# Patient Record
Sex: Female | Born: 1960 | Race: Black or African American | Hispanic: No | Marital: Married | Smoking: Never smoker
Health system: Southern US, Community
[De-identification: ages and names within clinical notes are randomized; demographics above are authoritative.]

## PROBLEM LIST (undated history)

## (undated) DIAGNOSIS — M7500 Adhesive capsulitis of unspecified shoulder: Secondary | ICD-10-CM

## (undated) DIAGNOSIS — I10 Essential (primary) hypertension: Secondary | ICD-10-CM

## (undated) DIAGNOSIS — M7752 Other enthesopathy of left foot: Secondary | ICD-10-CM

## (undated) DIAGNOSIS — M1990 Unspecified osteoarthritis, unspecified site: Secondary | ICD-10-CM

## (undated) DIAGNOSIS — D649 Anemia, unspecified: Secondary | ICD-10-CM

## (undated) DIAGNOSIS — M25569 Pain in unspecified knee: Secondary | ICD-10-CM

## (undated) DIAGNOSIS — D563 Thalassemia minor: Secondary | ICD-10-CM

## (undated) DIAGNOSIS — M5432 Sciatica, left side: Secondary | ICD-10-CM

## (undated) DIAGNOSIS — G5603 Carpal tunnel syndrome, bilateral upper limbs: Secondary | ICD-10-CM

## (undated) DIAGNOSIS — K219 Gastro-esophageal reflux disease without esophagitis: Secondary | ICD-10-CM

## (undated) DIAGNOSIS — E8881 Metabolic syndrome: Secondary | ICD-10-CM

## (undated) DIAGNOSIS — M62830 Muscle spasm of back: Secondary | ICD-10-CM

## (undated) DIAGNOSIS — E88819 Insulin resistance, unspecified: Secondary | ICD-10-CM

## (undated) DIAGNOSIS — M7730 Calcaneal spur, unspecified foot: Secondary | ICD-10-CM

## (undated) HISTORY — DX: Sciatica, left side: M54.32

## (undated) HISTORY — DX: Adhesive capsulitis of unspecified shoulder: M75.00

## (undated) HISTORY — DX: Metabolic syndrome: E88.81

## (undated) HISTORY — DX: Essential (primary) hypertension: I10

## (undated) HISTORY — DX: Other enthesopathy of left foot and ankle: M77.52

## (undated) HISTORY — DX: Insulin resistance, unspecified: E88.819

## (undated) HISTORY — PX: TOTAL ABDOMINAL HYSTERECTOMY: SHX209

## (undated) HISTORY — DX: Pain in unspecified knee: M25.569

## (undated) HISTORY — DX: Anemia, unspecified: D64.9

## (undated) HISTORY — DX: Unspecified osteoarthritis, unspecified site: M19.90

## (undated) HISTORY — DX: Carpal tunnel syndrome, bilateral upper limbs: G56.03

## (undated) HISTORY — DX: Thalassemia minor: D56.3

## (undated) HISTORY — DX: Gastro-esophageal reflux disease without esophagitis: K21.9

## (undated) HISTORY — DX: Muscle spasm of back: M62.830

## (undated) HISTORY — DX: Calcaneal spur, unspecified foot: M77.30

---

## 2004-07-29 ENCOUNTER — Encounter (INDEPENDENT_AMBULATORY_CARE_PROVIDER_SITE_OTHER): Payer: Self-pay | Admitting: *Deleted

## 2004-07-29 ENCOUNTER — Inpatient Hospital Stay (HOSPITAL_COMMUNITY): Admission: RE | Admit: 2004-07-29 | Discharge: 2004-07-31 | Payer: Self-pay | Admitting: Obstetrics and Gynecology

## 2005-01-01 ENCOUNTER — Encounter: Admission: RE | Admit: 2005-01-01 | Discharge: 2005-01-01 | Payer: Self-pay | Admitting: Obstetrics and Gynecology

## 2005-01-02 ENCOUNTER — Other Ambulatory Visit: Admission: RE | Admit: 2005-01-02 | Discharge: 2005-01-02 | Payer: Self-pay | Admitting: Obstetrics and Gynecology

## 2005-12-21 ENCOUNTER — Other Ambulatory Visit: Admission: RE | Admit: 2005-12-21 | Discharge: 2005-12-21 | Payer: Self-pay | Admitting: Family Medicine

## 2006-08-06 ENCOUNTER — Encounter: Admission: RE | Admit: 2006-08-06 | Discharge: 2006-08-06 | Payer: Self-pay | Admitting: Obstetrics and Gynecology

## 2007-08-11 ENCOUNTER — Encounter: Admission: RE | Admit: 2007-08-11 | Discharge: 2007-08-11 | Payer: Self-pay | Admitting: Obstetrics and Gynecology

## 2007-09-22 ENCOUNTER — Ambulatory Visit (HOSPITAL_COMMUNITY): Admission: RE | Admit: 2007-09-22 | Discharge: 2007-09-22 | Payer: Self-pay | Admitting: Family Medicine

## 2008-01-25 ENCOUNTER — Emergency Department (HOSPITAL_COMMUNITY): Admission: EM | Admit: 2008-01-25 | Discharge: 2008-01-25 | Payer: Self-pay | Admitting: Emergency Medicine

## 2008-04-23 ENCOUNTER — Emergency Department (HOSPITAL_COMMUNITY): Admission: EM | Admit: 2008-04-23 | Discharge: 2008-04-23 | Payer: Self-pay | Admitting: Emergency Medicine

## 2008-07-12 ENCOUNTER — Encounter: Admission: RE | Admit: 2008-07-12 | Discharge: 2008-10-10 | Payer: Self-pay | Admitting: Obstetrics and Gynecology

## 2008-08-14 ENCOUNTER — Encounter: Admission: RE | Admit: 2008-08-14 | Discharge: 2008-08-14 | Payer: Self-pay | Admitting: Obstetrics and Gynecology

## 2009-08-27 ENCOUNTER — Encounter: Admission: RE | Admit: 2009-08-27 | Discharge: 2009-08-27 | Payer: Self-pay | Admitting: Obstetrics and Gynecology

## 2010-11-09 ENCOUNTER — Encounter: Payer: Self-pay | Admitting: Obstetrics and Gynecology

## 2011-03-06 NOTE — Op Note (Signed)
NAMESUZEN, HETZER                 ACCOUNT NO.:  [id number]   MEDICAL RECORD NO.:  [id number]          PATIENT TYPE:  INP   LOCATION:  9303                          FACILITY:  [facility]   PHYSICIAN:  Hal Morales, M.D.DATE OF BIRTH:  31-Oct-1960   DATE OF PROCEDURE:  07/29/2004  DATE OF DISCHARGE:                                 OPERATIVE REPORT   PREOPERATIVE DIAGNOSES:  1.  Menorrhagia.  2.  Uterine fibroids.  3.  Pelvic pain.  4.  Anemia.   POSTOPERATIVE DIAGNOSES:  1.  Menorrhagia.  2.  Uterine fibroids.  3.  Pelvic pain.  4.  Anemia.  5.  Endometriosis.   PROCEDURE:  Total abdominal hysterectomy and bilateral salpingo-  oophorectomy.   SURGEON:  Hal Morales, M.D.   FIRST ASSISTANT:  Elmira J. Powell, P.A.-C.   ANESTHESIA:  General orotracheal.   ESTIMATED BLOOD LOSS:  350 mL.   COMPLICATIONS:  None.   FINDINGS:  The uterus was enlarged at approximately 18 week's size and  weighed 980 grams.  The ovaries were normal bilaterally except for miniscule  stigmata of endometriosis.  There was also stigmata of endometriosis on the  posterior uterosacral ligaments, primarily on the right side.  The tubes  appeared within normal limits.  There were dense adhesions between the  anterior peritoneum and the anterior uterus.   DESCRIPTION OF PROCEDURE:  The patient was taken to the operating room after  appropriate identification and placed on the operating table.  After the  attainment of adequate general anesthesia with the patient in the supine  position, the abdomen, perineum and vagina were prepped with multiple layers  of Betadine.  A Foley catheter was inserted into the bladder and connected  to straight drainage.  The abdomen was draped in a sterile field.  A midline  subcutaneous infiltration with 20 mL of 0.25% Marcaine was undertaken.  A  midline incision was made and the abdomen opened in layers.  The peritoneum  was entered and peritoneal washings obtained  which were later discarded.  A  self-retaining Balfour retractor was placed in the incision, and the bowel  packed cephalad.  Kelly clamps were placed at the cornual regions of the  uterus on either side and it was noted that the uterus could not be elevated  into the operative field.  The right round ligament was identified, suture  ligated and incised.  The right utero-ovarian ligament was identified,  clamped, cut, tied with a free tie and suture ligated.  On the left side the  round ligament was likewise identified, clamped, cut and suture ligated and  then incised on the anterior leaf of the broad ligament which was taken over  toward the right side.  The peritoneal reflection was then bluntly and  sharply dissected off the anterior cervix and off a large right paracervical  myoma.  The left utero-ovarian ligament was clamped, cut and suture ligated  in three separate portions until the entire ligament was free from the  uterus.  The uterine artery on the left side was skeletonized, clamped, cut  and suture ligated.  On the right side, a large right paracervical myoma was  denuded in order to allow for dissection of the anterior peritoneum off the  anterior cervix so that the uterine artery could be identified beneath the  large right paracervical myoma.  Once the uterine artery was clamped, cut  and suture ligated, the uterine fundus was excised from the cervix and  removed from the operative field.  The paracervical tissues and uterosacral  ligament and then vaginal angles were successfully clamped, cut and suture  ligated with the sutures on the uterosacral ligaments and the sutures on the  vaginal angles being held for later use.  The cervix was excised from the  upper vagina and the remainder of the vaginal cuff closed with figure-of-  eight sutures of 0 Vicryl.  The sutures holding the vaginal angles and the  sutures holding the uterosacral ligaments were tied together on either  side.  Hemostatic suture was placed on the uterosacral ligament on the right side  which had a point of bleeding.  The fallopian tube on the right side was  then elevated and successfully clamped, cut and suture ligated from the  underlying ovary to remove the right fallopian tube.  A similar procedure  was carried out on the opposite side with hemostasis noting to be adequate.  Sutures used for the salpingectomy were 2-0 Vicryl.  Copious irrigation was  carried out, and hemostasis achieved by placing sutures in the right pelvic  side wall bringing the peritoneal surfaces together.  The right and left  ureters were identified as the procedure was taking place and were  reidentified once the uterine fundus had been removed from the operative  field.  Copious irrigation was carried out and all instruments removed from  the abdominal cavity.  The abdominal incision was closed with a Smead-Jones  closure of 0 PDS from each apex to the midline of the incision and tied in  the midline.  A subcutaneous drain, 7 mm Jackson-Pratt was placed through a  stab incision in the right lower quadrant and the subcutaneous tissue  approximated with mattress sutures of 0 plain.  Staples were applied to the  skin incision for closure and the incision covered with a sterile dressing.  The patient was then awakened from general anesthesia and taken to the  recovery room in satisfactory condition having tolerated the procedure well  with sponge and instrument counts correct.  Specimens to pathology were  uterus, cervix, right and left fallopian tubes.      VPH/MEDQ  D:  07/29/2004  T:  07/30/2004  Job:  [id number]

## 2011-03-06 NOTE — H&P (Signed)
Allison Henderson, Allison Henderson                 ACCOUNT NO.:  [id number]   MEDICAL RECORD NO.:  [id number]          PATIENT TYPE:  INP   LOCATION:  9303                          FACILITY:  [facility]   PHYSICIAN:  Hal Morales, M.D.DATE OF BIRTH:  1961/02/14   DATE OF ADMISSION:  07/29/2004  DATE OF DISCHARGE:                                HISTORY & PHYSICAL   HISTORY OF PRESENT ILLNESS:  Allison Henderson is a 50 year old married African-  American female, gravida 0, with a long-standing history of symptomatic  uterine fibroids, who presents for hysterectomy because of menorrhagia,  anemia, pelvic pain, and dysmenorrhea.  For over 5 years, the patient has  experienced heavy vaginal bleeding, requiring her to change a super  overnight pad every hour, accompanied by severe abdominal cramps, which she  rates as a 10/10 on a 10 point scale.  The patient has found relief from her  cramping with the use of ibuprofen 800 mg, but on occasion will have to add  a narcotic in order to achieve reasonable relief which she rates as a 2/10  on a 10 point scale.  In the past several months, in spite of taking  continuous Ovcon 35, the patient experienced a prolonged breakthrough  bleeding episode lasting for approximately 15 days, in which she soaked a  super overnight pad every 15 minutes, and soiled both clothing and linen.  A  hemoglobin done on July 09, 2004 was reported as 8.0.  In the past  year, the patient has had a normal TSH, and a pelvic ultrasound in February  of 2005 revealed a multiple fibroid uterus measuring 15 cm x 11 cm x 11 cm,  with the largest fibroid being partially calcified and measuring 7 cm.  The  patient denies any fever, urinary tract symptoms, or vaginitis symptoms,  though she does report frequent constipation and positional dyspareunia.  The patient also was recently seen in her local emergency department in  [place] due to urinary retention, which was questionably related to the  position of her fibroid uterus.  A discussion was held with the patient  regarding the medical and surgical options available for management of her  symptomatic uterine fibroids, which included uterine artery embolization,  myomectomy, and hysterectomy, as well as observation.  Given that the  patient has failed hormonal therapy and the significant debilitation of her  symptoms, she has opted to proceed with definitive therapy in the form of  hysterectomy.   PAST MEDICAL HISTORY:   OBSTETRIC HISTORY:  Gravida 0.   GYNECOLOGIC HISTORY:  Menarche at 50 years old.  Last menstrual period was  July 01, 2004.  The patient uses oral contraceptives as her method of  contraception.  She does not have any history of abnormal Pap smears or  sexually transmitted diseases.  Her last Pap smear was March of 2005, which  was within normal limits.  The last mammogram was in February of 2005, which  was within normal limits.   MEDICAL HISTORY:  Positive for endocervical polyps, migraine headaches,  anemia, and knee arthritis.   SURGICAL HISTORY:  Negative.  The patient denies any history of blood  transfusions.   FAMILY HISTORY:  Positive for breast cancer (sister age 58), hypertension,  cardiovascular disease, and noninsulin-dependent diabetes mellitus.   SOCIAL HISTORY:  The patient is married, and she is employed as a Acupuncturist at [employer].   HABITS:  She does not use alcohol or tobacco.   CURRENT MEDICATIONS:  1.  Chromagen one tablet twice daily.  2.  Levlen one tablet daily.   ALLERGIES:  1.  PENICILLIN, which causes severe itching.  2.  BEXTRA, which causes severe itching.   REVIEW OF SYSTEMS:  Negative, except as mentioned in the history of present  illness.   PHYSICAL EXAMINATION:  VITAL SIGNS:  Blood pressure is 148/78, weight 315.5  pounds, height 5 feet, 6 inches tall.  NECK:  Supple without masses.  There is no thyromegaly or adenopathy.  HEART:   Regular rate and rhythm.  There is no murmur.  LUNGS:  Clear to auscultation.  There are no wheezes, rales, or rhonchi.  BACK:  No CVA tenderness.  ABDOMEN:  Bowel sounds are present.  It is soft.  The patient does have a  palpable mass in the suprapubic area with mild right lower quadrant  tenderness; however, there is no rebound or organomegaly.  EXTREMITIES:  Without clubbing, cyanosis, or edema.  PELVIC:  EGBUS is within normal limits.  Vaginal is rugosed.  Cervix is  nontender without lesions.  The uterus appears 16-18 weeks size, and  completely fills the pelvis with decreased mobility.  Adnexa with no  palpable masses or tenderness.  RECTOVAGINAL:  No masses.   IMPRESSION:  1.  Symptomatic uterine fibroids.  2.  Menorrhagia.  3.  Anemia.  4.  Dysmenorrhea (cannot rule out endometriosis).  5.  Pelvic pain.   DISPOSITION:  As previously outlined, a long discussion was held with the  patient regarding the medical and surgical options for management of her  presenting symptoms, along with the risks and benefits of each.  The patient  has decided to proceed with hysterectomy, and understands the indications  for this procedure, along with its risks, which including, but are not  limited to, reaction to anesthesia, damage to adjacent organs, and  infection.  The patient also understands that she will no longer be able to  bear children as a result of the removal of her uterus.  The patient has  consented to undergo a total abdominal hysterectomy at [facility]  [place] on July 29, 2004 at 12:30 p.m.      EJP/MEDQ  D:  07/29/2004  T:  07/29/2004  Job:  [id number]

## 2011-03-06 NOTE — Discharge Summary (Signed)
NAMEPEPPER, GRAMLING                 ACCOUNT NO.:  [id number]   MEDICAL RECORD NO.:  [id number]          PATIENT TYPE:  INP   LOCATION:  9303                          [facility]ACILITY:  [facility]   PHYSICIAN:  Hal Morales, M.D.DATE OF BIRTH:  1961-06-26   DATE OF ADMISSION:  07/29/2004  DATE OF DISCHARGE:  07/31/2004                                 DISCHARGE SUMMARY   DISCHARGE DIAGNOSES:  1.  Symptomatic uterine fibroids.  2.  Menorrhagia.  3.  Dysmenorrhea.  4.  Pelvic pain.  5.  Severe anemia.  6.  Endometriosis.  7.  Pelvic adhesions.   OPERATION:  On the date of admission the patient underwent a total abdominal  hysterectomy with bilateral salpingectomy with lysis of adhesions,  tolerating all procedures well.  The patient was found to have a fibroid  uterus weighing approximately 980.5 g with pelvic adhesions and stigmata  consistent with endometriosis.  The patient had normal-appearing tubes and  ovaries bilaterally.   HISTORY OF PRESENT ILLNESS:  [name] is a 50 year old married African-  American female gravida 0 with a longstanding history of symptomatic uterine  fibroids who presents for hysterectomy because of menorrhagia, anemia,  pelvic pain, and dysmenorrhea.  Please see the patient's dictated History  and Physical Examination for details.   PREOPERATIVE PHYSICAL EXAMINATION:  VITAL SIGNS:  Blood pressure 148/78,  weight 315.5 pounds, height 5 feet 6 inches tall.  GENERAL:  Within normal limits.  However, abdominal exam did reveal a  palpable mass in the suprapubic area with mild right lower quadrant  tenderness; however, there was no rebound or organomegaly.  PELVIC:  EG/BUS was within normal limits.  The vagina was rugous.  The  cervix was nontender, without lesions.  The uterus appeared 16- to 18-week  size and completely filled the pelvis, with decreased mobility.  Adnexa  without any palpable masses or tenderness.  Rectovaginal exam was without  masses.   HOSPITAL COURSE:  On the date of admission the patient underwent  aforementioned procedures, tolerating them all well.  Postoperative course  was unremarkable, with the patient tolerating a postoperative hemoglobin of  8.0 (preoperative hemoglobin was 7.9).  By postoperative day #2, the patient  had resumed bowel and bladder function and was therefore deemed ready for  discharge home.   DISCHARGE MEDICATIONS:  1.  Tylox one to two tablets q.4-6h. as needed for pain.  2.  Ibuprofen 600 mg one tablet with food q.6h. for 5 days, then as needed      for pain.  3.  Ferrous sulfate 325 mg one tablet twice daily for 6 weeks.  4.  Phenergan 25 mg one tablet q.6h. as needed for nausea.  5.  Colace 100 mg one tablet twice daily until bowel movements are regular.   FOLLOW-UP:  The patient is scheduled for a staple removal at [facility] on August 06, 2004.  She has a 6 weeks postoperative visit  scheduled with Dr. Dierdre Forth on September 03, 2004 at 2:30 p.m.   DISCHARGE INSTRUCTIONS:  The patient was given a copy of  [facility] postoperative instruction sheet.  She was further advised to avoid  driving for 2 weeks, heavy lifting for 4 weeks, and intercourse for 6 weeks.   FINAL PATHOLOGY:  Uterus and cervix, right and left fallopian tubes:  1.  Uterus - Cervix:  Chronic cervicitis with squamous metaplasia, no      intraepithelial lesion identified.  Endometrium:  Inactive endometrial      glands and decidualized stroma suggestive of exogenous progestin      therapy; benign endometrial polyp 2.8 cm.  Myometrium:  Multiple      leiomyomas including a submucosal leiomyoma.  Serosa:  Endometriosis and      adhesions.  2.  Right and left fallopian tubes:  Benign fallopian tubes with paratubal      cyst.     Elmi   EJP/MEDQ  D:  08/15/2004  T:  08/15/2004  Job:  [id number]

## 2012-02-15 ENCOUNTER — Other Ambulatory Visit: Payer: Self-pay | Admitting: Obstetrics and Gynecology

## 2012-02-16 NOTE — Telephone Encounter (Signed)
This is ep pt

## 2012-02-29 NOTE — Telephone Encounter (Deleted)
Call in wrong rx

## 2012-02-29 NOTE — Telephone Encounter (Deleted)
done

## 2012-03-01 NOTE — Telephone Encounter (Signed)
Per patient, had called in Rx # to pharmacy for refill on another medication with similar number to the Z-pak. Therefore order was cancelled and not filled.

## 2012-03-24 ENCOUNTER — Other Ambulatory Visit (HOSPITAL_COMMUNITY): Payer: Self-pay | Admitting: Obstetrics and Gynecology

## 2012-03-24 DIAGNOSIS — D649 Anemia, unspecified: Secondary | ICD-10-CM

## 2012-03-24 DIAGNOSIS — R7303 Prediabetes: Secondary | ICD-10-CM

## 2012-03-24 DIAGNOSIS — E559 Vitamin D deficiency, unspecified: Secondary | ICD-10-CM

## 2012-04-01 ENCOUNTER — Other Ambulatory Visit: Payer: Self-pay

## 2012-04-01 DIAGNOSIS — D509 Iron deficiency anemia, unspecified: Secondary | ICD-10-CM

## 2012-04-01 DIAGNOSIS — R7303 Prediabetes: Secondary | ICD-10-CM

## 2012-04-01 DIAGNOSIS — D649 Anemia, unspecified: Secondary | ICD-10-CM

## 2012-04-01 DIAGNOSIS — R7309 Other abnormal glucose: Secondary | ICD-10-CM

## 2012-04-01 DIAGNOSIS — E559 Vitamin D deficiency, unspecified: Secondary | ICD-10-CM

## 2012-04-01 LAB — COMPREHENSIVE METABOLIC PANEL
AST: 14 U/L (ref 0–37)
Albumin: 3.8 g/dL (ref 3.5–5.2)
BUN: 13 mg/dL (ref 6–23)
CO2: 27 mEq/L (ref 19–32)
Calcium: 8.9 mg/dL (ref 8.4–10.5)
Chloride: 103 mEq/L (ref 96–112)
Creat: 0.72 mg/dL (ref 0.50–1.10)
Glucose, Bld: 93 mg/dL (ref 70–99)
Potassium: 4.5 mEq/L (ref 3.5–5.3)

## 2012-04-01 LAB — LIPID PANEL
Cholesterol: 201 mg/dL — ABNORMAL HIGH (ref 0–200)
HDL: 61 mg/dL (ref >39)
Total CHOL/HDL Ratio: 3.3 Ratio
Triglycerides: 86 mg/dL (ref <150)

## 2012-04-02 LAB — CBC WITH DIFFERENTIAL/PLATELET
Basophils Absolute: 0 10*3/uL (ref 0.0–0.1)
Basophils Relative: 0 % (ref 0–1)
HCT: 34.5 % — ABNORMAL LOW (ref 36.0–46.0)
Hemoglobin: 10.6 g/dL — ABNORMAL LOW (ref 12.0–15.0)
Lymphocytes Relative: 30 % (ref 12–46)
MCHC: 30.7 g/dL (ref 30.0–36.0)
Monocytes Absolute: 0.7 10*3/uL (ref 0.1–1.0)
Monocytes Relative: 7 % (ref 3–12)
Neutro Abs: 5.4 10*3/uL (ref 1.7–7.7)
Neutrophils Relative %: 62 % (ref 43–77)
RBC: 5.19 MIL/uL — ABNORMAL HIGH (ref 3.87–5.11)
WBC: 8.9 10*3/uL (ref 4.0–10.5)

## 2012-04-02 LAB — HEMOGLOBIN A1C
Hgb A1c MFr Bld: 6.2 % — ABNORMAL HIGH (ref <5.7)
Mean Plasma Glucose: 131 mg/dL — ABNORMAL HIGH (ref <117)

## 2012-04-02 LAB — INSULIN, FASTING: Insulin fasting, serum: 22 u[IU]/mL (ref 3–28)

## 2012-04-24 ENCOUNTER — Other Ambulatory Visit: Payer: Self-pay | Admitting: Obstetrics and Gynecology

## 2012-06-06 ENCOUNTER — Other Ambulatory Visit: Payer: Self-pay | Admitting: Obstetrics and Gynecology

## 2012-06-06 MED ORDER — CYCLOBENZAPRINE HCL 10 MG PO TABS: 10.0000 mg | ORAL_TABLET | Freq: Three times a day (TID) | ORAL | Status: AC | PRN

## 2012-09-30 ENCOUNTER — Encounter: Payer: Self-pay | Admitting: Obstetrics and Gynecology

## 2012-09-30 ENCOUNTER — Ambulatory Visit (INDEPENDENT_AMBULATORY_CARE_PROVIDER_SITE_OTHER): Payer: PRIVATE HEALTH INSURANCE | Admitting: Obstetrics and Gynecology

## 2012-09-30 ENCOUNTER — Telehealth: Payer: Self-pay

## 2012-09-30 VITALS — BP 110/70 | Temp 98.8°F | Ht 64.5 in | Wt 353.0 lb

## 2012-09-30 DIAGNOSIS — Z124 Encounter for screening for malignant neoplasm of cervix: Secondary | ICD-10-CM

## 2012-09-30 DIAGNOSIS — K219 Gastro-esophageal reflux disease without esophagitis: Secondary | ICD-10-CM

## 2012-09-30 DIAGNOSIS — Z01419 Encounter for gynecological examination (general) (routine) without abnormal findings: Secondary | ICD-10-CM

## 2012-09-30 DIAGNOSIS — E8881 Metabolic syndrome: Secondary | ICD-10-CM

## 2012-09-30 DIAGNOSIS — B9789 Other viral agents as the cause of diseases classified elsewhere: Secondary | ICD-10-CM

## 2012-09-30 DIAGNOSIS — R05 Cough: Secondary | ICD-10-CM

## 2012-09-30 DIAGNOSIS — Z20828 Contact with and (suspected) exposure to other viral communicable diseases: Secondary | ICD-10-CM

## 2012-09-30 DIAGNOSIS — N898 Other specified noninflammatory disorders of vagina: Secondary | ICD-10-CM

## 2012-09-30 DIAGNOSIS — B349 Viral infection, unspecified: Secondary | ICD-10-CM

## 2012-09-30 LAB — HEMOGLOBIN A1C: Hgb A1c MFr Bld: 6.2 % — ABNORMAL HIGH (ref <5.7)

## 2012-09-30 LAB — POCT WET PREP (WET MOUNT)
Whiff Test: NEGATIVE
pH: 4.5

## 2012-09-30 LAB — LIPID PANEL
Cholesterol: 212 mg/dL — ABNORMAL HIGH (ref 0–200)
HDL: 59 mg/dL (ref >39)
Triglycerides: 180 mg/dL — ABNORMAL HIGH (ref <150)

## 2012-09-30 LAB — INFLUENZA A AND B

## 2012-09-30 MED ORDER — AZITHROMYCIN 250 MG PO TABS: ORAL_TABLET | ORAL | Status: DC

## 2012-09-30 MED ORDER — GUAIFENESIN-CODEINE 100-10 MG/5ML PO SYRP: 5.0000 mL | ORAL_SOLUTION | Freq: Three times a day (TID) | ORAL | Status: DC | PRN

## 2012-09-30 MED ORDER — NYSTATIN-TRIAMCINOLONE 100000-0.1 UNIT/GM-% EX OINT: TOPICAL_OINTMENT | Freq: Two times a day (BID) | CUTANEOUS | Status: DC

## 2012-09-30 MED ORDER — FLUCONAZOLE 150 MG PO TABS: 150.0000 mg | ORAL_TABLET | Freq: Once | ORAL | Status: DC

## 2012-09-30 MED ORDER — CLINDAMYCIN HCL 300 MG PO CAPS: 300.0000 mg | ORAL_CAPSULE | Freq: Three times a day (TID) | ORAL | Status: DC

## 2012-09-30 NOTE — Telephone Encounter (Signed)
Lm on vm to cb per test results.

## 2012-09-30 NOTE — Telephone Encounter (Signed)
Tc from pt. Informed pt Influenza A and B=both negative. Pt agrees

## 2012-09-30 NOTE — Progress Notes (Signed)
Regular Periods: no Mammogram: yes  Monthly Breast Ex.: yes Exercise: no  Tetanus < 10 years: yes Seatbelts: yes  NI. Bladder Functn.: yes Abuse at home: no  Daily BM's: yes Stressful Work: yes  Healthy Diet: yes Sigmoid-Colonoscopy: 2013  NL  Calcium: yes Medical problems this year: WILL DISCUSS   LAST PAP:2012  NL  Contraception: NONE. PT HAD HYST  Mammogram:  2 YEARS AGO  PCP: DR. Valentina Lucks  PMH: NO CHANGE  FMH: NO CHANGE  Last Bone Scan: NO

## 2012-09-30 NOTE — Progress Notes (Signed)
Subjective:    DETRA BORES is a 51 y.o. female, G0P0, who presents for an annual exam. The patient reports recent intense flare up  of acid reflux that now has developed into a cough with foul smelling sputum.  Denies any fever but this morning began to have sudden and significant myalgias, mild nasal congestion, nausea and pain in chest with cough. Additionally complains of a vaginal bump that was tender for the past week but seemingly is better now,  along with a chronically itchy area just right of her upper vulva.  Menstrual cycle:   LMP: No LMP recorded. Patient has had a hysterectomy.             Review of Systems Pertinent items are noted in HPI. Denies pelvic pain, urinary tract symptoms, vaginitis symptoms, irregular bleeding, menopausal symptoms, change in bowel habits or rectal bleeding   Objective:    BP 110/70  Temp 98.8 F (37.1 C) (Oral)  Ht 5' 4.5" (1.638 m)  Wt 353 lb (160.12 kg)  BMI 59.66 kg/m2   Wt Readings from Last 1 Encounters:  09/30/12 353 lb (160.12 kg)   Body mass index is 59.66 kg/(m^2). General Appearance: Alert, no acute distress HEENT: Grossly normal Neck / Thyroid: Supple, no thyromegaly or cervical adenopathy Lungs: Clear to auscultation bilaterally though breath sounds are distant Back: No CVA tenderness Breast Exam: Pendulous with no palpable masses or nodes.No dimpling, nipple retraction or discharge; tender;  axillary breast tissue bilaterally Cardiovascular: Regular rate and rhythm.  Gastrointestinal: Soft, non-tender, no masses or organomegaly Pelvic Exam: EGBUS-right vulvar apex with dry, pigmented excoriated skin, no visible lesions or inflammation; left mons, approximately 1 cm from vulvar apex with tender firm nodule though not visible nor does it show any pointing;  vagina-normal, cervix/uterus-surgically absent;  adnexae-no masses or tenderness (exam limited by habitus) Rectovaginal: no masses and normal sphincter tone Lymphatic Exam:  Non-palpable nodes in neck, clavicular,  axillary, or inguinal regions  Skin: no rashes or abnormalities Extremities: no clubbing cyanosis or edema  Neurologic: grossly normal Psychiatric: Alert and oriented  Wet Prep: ph-4.5,  whiff-negative, no clue, yeast or trichomoniasis   Assessment:   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 Tinea Mons Follicular Exposure to influenza   Plan:  Reviewed revised PAP smear guidelines  PAP sent-patient request  Warm soaks for mons bump  Tuss-Organidin 5 ml every 6 hours as needed for cough  Clindamycin 300 mg #21 tid x 7 days  Diflucan 150 mg  #1 1 po stat 1 refill  Mycolog II Cream #30 grams apply to affected area bid x 7-14 days 1 refill  Lipid Panel, HgbA1C, nasal swab for flu testing-pending  RTO 1 year or prn  Hamp Moreland,ELMIRAPA-C

## 2012-10-03 DIAGNOSIS — E88819 Insulin resistance, unspecified: Secondary | ICD-10-CM | POA: Insufficient documentation

## 2012-10-03 DIAGNOSIS — M5432 Sciatica, left side: Secondary | ICD-10-CM | POA: Insufficient documentation

## 2012-10-03 DIAGNOSIS — M62830 Muscle spasm of back: Secondary | ICD-10-CM | POA: Insufficient documentation

## 2012-10-03 DIAGNOSIS — G8929 Other chronic pain: Secondary | ICD-10-CM | POA: Insufficient documentation

## 2012-10-03 DIAGNOSIS — K219 Gastro-esophageal reflux disease without esophagitis: Secondary | ICD-10-CM | POA: Insufficient documentation

## 2012-10-03 DIAGNOSIS — E8881 Metabolic syndrome: Secondary | ICD-10-CM | POA: Insufficient documentation

## 2012-10-03 LAB — PAP IG W/ RFLX HPV ASCU

## 2012-10-13 ENCOUNTER — Other Ambulatory Visit: Payer: Self-pay | Admitting: Obstetrics and Gynecology

## 2012-10-13 MED ORDER — ALBUTEROL SULFATE HFA 108 (90 BASE) MCG/ACT IN AERS: 2.0000 | INHALATION_SPRAY | Freq: Four times a day (QID) | RESPIRATORY_TRACT | Status: AC | PRN

## 2012-10-13 MED ORDER — TRIAMCINOLONE ACETONIDE 0.025 % EX OINT: TOPICAL_OINTMENT | Freq: Two times a day (BID) | CUTANEOUS | Status: DC

## 2012-10-13 MED ORDER — NYSTATIN 100000 UNIT/GM EX CREA: TOPICAL_CREAM | Freq: Two times a day (BID) | CUTANEOUS | Status: DC

## 2012-10-14 ENCOUNTER — Other Ambulatory Visit: Payer: Self-pay

## 2012-10-17 ENCOUNTER — Other Ambulatory Visit: Payer: Self-pay

## 2012-10-17 MED ORDER — METFORMIN HCL 500 MG PO TABS: 500.0000 mg | ORAL_TABLET | Freq: Two times a day (BID) | ORAL | Status: DC

## 2012-10-18 ENCOUNTER — Other Ambulatory Visit: Payer: Self-pay

## 2012-10-18 MED ORDER — METFORMIN HCL 500 MG PO TABS: 500.0000 mg | ORAL_TABLET | Freq: Three times a day (TID) | ORAL | Status: DC

## 2012-11-21 ENCOUNTER — Encounter: Payer: Self-pay | Admitting: Obstetrics and Gynecology

## 2012-12-05 ENCOUNTER — Other Ambulatory Visit: Payer: PRIVATE HEALTH INSURANCE

## 2012-12-05 ENCOUNTER — Other Ambulatory Visit: Payer: Self-pay | Admitting: Obstetrics and Gynecology

## 2012-12-05 MED ORDER — KETOROLAC TROMETHAMINE 30 MG/ML IJ SOLN: 30.0000 mg | Freq: Once | INTRAMUSCULAR | Status: DC

## 2012-12-05 MED ORDER — KETOROLAC TROMETHAMINE 30 MG/ML IJ SOLN
30.0000 mg | Freq: Once | INTRAMUSCULAR | Status: AC
  Administered 2012-12-05: 30 mg via INTRAVENOUS

## 2012-12-05 NOTE — Progress Notes (Signed)
51 YO with history of back spasms had an almost fall (slipped) on ice on yesterday and is complaining of back pain today.  Took some anti-inflammatory on yesterday and use topical linament with good results.  Has not taken any pain medication today.  Patient to receive Toradol 30 mg IM stat.  Kasidi Shanker, PA-C

## 2012-12-09 ENCOUNTER — Other Ambulatory Visit: Payer: Self-pay | Admitting: Obstetrics and Gynecology

## 2012-12-09 MED ORDER — FAMOTIDINE 40 MG PO TABS: 20.0000 mg | ORAL_TABLET | Freq: Every day | ORAL | Status: DC | PRN

## 2014-12-27 ENCOUNTER — Encounter (HOSPITAL_COMMUNITY): Payer: Self-pay | Admitting: Emergency Medicine

## 2014-12-27 ENCOUNTER — Emergency Department (HOSPITAL_COMMUNITY)
Admission: EM | Admit: 2014-12-27 | Discharge: 2014-12-27 | Discharge status: Against Medical Advice | Payer: Managed Care, Other (non HMO) | Attending: Emergency Medicine | Admitting: Emergency Medicine

## 2014-12-27 DIAGNOSIS — R42 Dizziness and giddiness: Secondary | ICD-10-CM | POA: Diagnosis not present

## 2014-12-27 NOTE — ED Notes (Signed)
Pt BIB friend.  C/o dizziness and lt sided numbness x 2 wks.

## 2015-04-19 ENCOUNTER — Other Ambulatory Visit: Payer: Self-pay | Admitting: Sports Medicine

## 2015-04-19 DIAGNOSIS — IMO0002 Reserved for concepts with insufficient information to code with codable children: Secondary | ICD-10-CM

## 2015-04-26 ENCOUNTER — Other Ambulatory Visit: Payer: Managed Care, Other (non HMO)

## 2015-04-29 ENCOUNTER — Ambulatory Visit
Admission: RE | Admit: 2015-04-29 | Discharge: 2015-04-29 | Disposition: A | Discharge status: Home / Self Care | Payer: Managed Care, Other (non HMO) | Source: Ambulatory Visit | Attending: Sports Medicine | Admitting: Sports Medicine

## 2015-04-29 ENCOUNTER — Encounter (INDEPENDENT_AMBULATORY_CARE_PROVIDER_SITE_OTHER): Payer: Self-pay

## 2015-04-29 DIAGNOSIS — IMO0002 Reserved for concepts with insufficient information to code with codable children: Secondary | ICD-10-CM

## 2015-10-18 ENCOUNTER — Encounter: Payer: Self-pay | Admitting: Cardiology

## 2015-10-18 ENCOUNTER — Ambulatory Visit (INDEPENDENT_AMBULATORY_CARE_PROVIDER_SITE_OTHER): Payer: Managed Care, Other (non HMO) | Admitting: Cardiology

## 2015-10-18 VITALS — BP 148/100 | HR 85 | Ht 64.0 in | Wt 356.0 lb

## 2015-10-18 DIAGNOSIS — I517 Cardiomegaly: Secondary | ICD-10-CM | POA: Diagnosis not present

## 2015-10-18 DIAGNOSIS — R0602 Shortness of breath: Secondary | ICD-10-CM

## 2015-10-18 DIAGNOSIS — I10 Essential (primary) hypertension: Secondary | ICD-10-CM | POA: Diagnosis not present

## 2015-10-18 DIAGNOSIS — Z136 Encounter for screening for cardiovascular disorders: Secondary | ICD-10-CM

## 2015-10-18 NOTE — Progress Notes (Signed)
Cardiology Office Note  Date: 10/18/2015   ID: Allison Henderson, DOB [birth year], MRN [id number]  PCP: Astrid Divine, MD  Primary Cardiologist: Nona Dell, MD   Chief Complaint  Patient presents with  . Cardiomegaly    History of Present Illness: Allison Henderson is a 54 y.o. female referred for cardiology consultation by [name] at [facility] an [place], [place].  Records indicate recent evaluation at that facility with treatment for pneumonia. Chest x-ray reportedly showed normal mediastinum with no pleural fluid, no CHF, no masses , and a right lower lobe infiltrate. Cardiomegaly was also described.  She comes in today with her sister, very anxious about this referral. She states that she is feeling better in terms of recent cough and diagnosed pneumonia, continues on antibiotics. At baseline she has NYHA class 1-2 dyspnea, no exertional chest pain. She states that her job is stressful, she works at a [employer] in [place]. She does not have any history of cardiomyopathy, arrhythmias, or CAD. I did review the CD images of her chest x-ray, and heart shadow is enlarged. I was not able to get a copy of the ECG done at her recent visit in [place], however lab work was sent including a follow-up BNP that was normal at 37, normal d-dimer of 0.5, normal troponin I of 0.01. Additional findings are outlined below. ECG done today in the office shows sinus rhythm with low voltage, decreased R wave progression, and nonspecific T-wave changes. There is no old tracing for comparison.  She reports a three-year history of hypertension and has been on lisinopril. She reports compliance with her medication. She states that typically her systolic blood pressures in the 130s.  Past Medical History  Diagnosis Date  . Tendonitis of ankle, left   . Insulin resistance   . Thalassemia trait   . Anemia   . Carpal tunnel syndrome on both sides   . Frozen  shoulder     Left  . Calcaneal spur of foot   . Gastroesophageal reflux disease   . Knee joint pain     Bilateral;  R>L  . Lumbar paraspinal muscle spasm   . Left sided sciatica   . Essential hypertension     Past Surgical History  Procedure Laterality Date  . Total abdominal hysterectomy      Current Outpatient Prescriptions  Medication Sig Dispense Refill  . albuterol (PROVENTIL HFA;VENTOLIN HFA) 108 (90 BASE) MCG/ACT inhaler Inhale 2 puffs into the lungs every 6 (six) hours as needed for wheezing. 1 Inhaler 2  . cholecalciferol (VITAMIN D) 1000 UNITS tablet Take 1,000 Units by mouth daily.    . Cyanocobalamin (VITAMIN B 12 PO) Take by mouth.    . famotidine (PEPCID) 40 MG tablet Take 0.5 tablets (20 mg total) by mouth daily as needed for heartburn. 30 tablet 5  . fish oil-omega-3 fatty acids 1000 MG capsule Take 2 g by mouth daily.    Marland Kitchen ibuprofen (ADVIL,MOTRIN) 800 MG tablet TAKE 1 TABLET EVERY 8 HOURS AS NEEDED FOR PAIN 90 tablet 3  . levofloxacin (LEVAQUIN) 750 MG tablet Take 750 mg by mouth daily.    Marland Kitchen lisinopril (PRINIVIL,ZESTRIL) 10 MG tablet Take 10 mg by mouth daily.    . metFORMIN (GLUCOPHAGE) 500 MG tablet Take 500 mg by mouth 2 (two) times daily with a meal.     No current facility-administered medications for this visit.   Allergies:  Bextra; Morphine and related; Other; and  Penicillins   Social History: The patient  reports that she has never smoked. She has never used smokeless tobacco. She reports that she does not drink alcohol or use illicit drugs.   Family History: The patient's family history includes Anemia in her mother and sister; Breast cancer (age of onset: 45) in her sister; Colon cancer in her other; Diabetes in her brother and father; Emphysema in her maternal grandfather; Heart attack in her paternal grandfather; Hypertension in her brother, father, and mother; Lymphoma in her maternal grandmother; Osteoarthritis in her mother.   ROS:  Please see the  history of present illness. Otherwise, complete review of systems is positive for improved cough. No fevers or chills..  All other systems are reviewed and negative.   Physical Exam: VS:  BP 148/100 mmHg  Pulse 85  Ht 5\' 4"  (1.626 m)  Wt 356 lb (161.481 kg)  BMI 61.08 kg/m2  SpO2 94%, BMI Body mass index is 61.08 kg/(m^2).  Wt Readings from Last 3 Encounters:  10/18/15 356 lb (161.481 kg)  09/30/12 353 lb (160.12 kg)    General: Morbidly obese woman, no distress. HEENT: Conjunctiva and lids normal, oropharynx clear. Neck: Supple, no elevated JVP or carotid bruits, no thyromegaly. Lungs: Clear to auscultation, nonlabored breathing at rest. Cardiac: Cannot assess PMI, RRR, no S3 or significant systolic murmur, no pericardial rub. Abdomen: Soft, nontender, bowel sounds present, no guarding or rebound. Extremities: No pitting edema, distal pulses 2+. Skin: Warm and dry. Musculoskeletal: No kyphosis. Neuropsychiatric: Alert and oriented x3, affect grossly appropriate.  ECG: ECG is ordered today.  Recent Labwork:  December 2016: Sodium 140, potassium 4.1, BUN 11, creatinine 0.7, hemoglobin 10.7 , MCV 67.8 , platelets 207  Assessment and Plan:  1. Cardiomegaly noted on recent PA and lateral chest x-ray during clinical encounter for pneumonia. She is improving in terms of cough on antibiotics and at baseline does not have any limiting dyspnea on exertion, no history of cardiomyopathy or arrhythmias. ECG reviewed and nonspecific. She does have a history of hypertension and is on lisinopril. At this time I have recommended an echocardiogram to better assess actual cardiac structure and function. We will plan to call her with the results.  2. Essential hypertension, on lisinopril. Blood pressure elevated today in the setting of anxiety. She states that typically her systolic blood pressures in the 130s. Follow-up with primary care provider.  3. Morbid obesity. We did discuss benefits of  weight loss.  4. Type 2 diabetes mellitus, currently on Glucophage.  Current medicines were reviewed with the patient today.   Orders Placed This Encounter  Procedures  . EKG 12-Lead  . ECHOCARDIOGRAM COMPLETE    Disposition: Call with results.   Signed, Jonelle Sidle, MD, [facility] 10/18/2015 9:51 AM    [facility] at [facility] [address] [place], [place], [place] [address] Phone: [phone]; Fax: [phone]

## 2015-10-18 NOTE — Patient Instructions (Signed)
Your physician has requested that you have an echocardiogram. Echocardiography is a painless test that uses sound waves to create images of your heart. It provides your doctor with information about the size and shape of your heart and how well your heart's chambers and valves are working. This procedure takes approximately one hour. There are no restrictions for this procedure. Office will contact with results via phone or letter.   Continue all current medications. Follow up based on test results  

## 2015-10-23 ENCOUNTER — Ambulatory Visit (INDEPENDENT_AMBULATORY_CARE_PROVIDER_SITE_OTHER): Payer: Managed Care, Other (non HMO)

## 2015-10-23 ENCOUNTER — Telehealth: Payer: Self-pay | Admitting: *Deleted

## 2015-10-23 ENCOUNTER — Other Ambulatory Visit: Payer: Self-pay

## 2015-10-23 DIAGNOSIS — I517 Cardiomegaly: Secondary | ICD-10-CM | POA: Diagnosis not present

## 2015-10-23 DIAGNOSIS — R0602 Shortness of breath: Secondary | ICD-10-CM

## 2015-10-23 NOTE — Telephone Encounter (Signed)
-----   Message from Satira Sark, MD sent at 10/23/2015  3:52 PM EST ----- Reviewed report. Please let her know that her heart size is normal. LVEF is also vigorous at 65-70%. She does have mild diastolic dysfunction which would go along with her hypertension. Overall reassuring results. Would continue medical therapy and keep follow-up with PCP. No additional cardiac testing is anticipated at this time.

## 2015-10-23 NOTE — Telephone Encounter (Signed)
Patient informed. 

## 2015-11-05 ENCOUNTER — Other Ambulatory Visit: Payer: Self-pay | Admitting: Family Medicine

## 2015-11-05 ENCOUNTER — Ambulatory Visit
Admission: RE | Admit: 2015-11-05 | Discharge: 2015-11-05 | Disposition: A | Discharge status: Home / Self Care | Payer: Managed Care, Other (non HMO) | Source: Ambulatory Visit | Attending: Family Medicine | Admitting: Family Medicine

## 2015-11-05 DIAGNOSIS — J189 Pneumonia, unspecified organism: Secondary | ICD-10-CM

## 2016-01-28 ENCOUNTER — Emergency Department (HOSPITAL_COMMUNITY): Payer: Managed Care, Other (non HMO)

## 2016-01-28 ENCOUNTER — Emergency Department (HOSPITAL_COMMUNITY)
Admission: EM | Admit: 2016-01-28 | Discharge: 2016-01-28 | Disposition: A | Discharge status: Home / Self Care | Payer: Managed Care, Other (non HMO) | Attending: Emergency Medicine | Admitting: Emergency Medicine

## 2016-01-28 ENCOUNTER — Encounter (HOSPITAL_COMMUNITY): Payer: Self-pay | Admitting: *Deleted

## 2016-01-28 DIAGNOSIS — R42 Dizziness and giddiness: Secondary | ICD-10-CM | POA: Diagnosis not present

## 2016-01-28 DIAGNOSIS — Z8639 Personal history of other endocrine, nutritional and metabolic disease: Secondary | ICD-10-CM | POA: Insufficient documentation

## 2016-01-28 DIAGNOSIS — Z8669 Personal history of other diseases of the nervous system and sense organs: Secondary | ICD-10-CM | POA: Diagnosis not present

## 2016-01-28 DIAGNOSIS — Z8739 Personal history of other diseases of the musculoskeletal system and connective tissue: Secondary | ICD-10-CM | POA: Diagnosis not present

## 2016-01-28 DIAGNOSIS — R51 Headache: Secondary | ICD-10-CM

## 2016-01-28 DIAGNOSIS — Z880 Allergy status to penicillin: Secondary | ICD-10-CM | POA: Diagnosis not present

## 2016-01-28 DIAGNOSIS — K219 Gastro-esophageal reflux disease without esophagitis: Secondary | ICD-10-CM | POA: Insufficient documentation

## 2016-01-28 DIAGNOSIS — R202 Paresthesia of skin: Secondary | ICD-10-CM | POA: Diagnosis not present

## 2016-01-28 DIAGNOSIS — R110 Nausea: Secondary | ICD-10-CM | POA: Insufficient documentation

## 2016-01-28 DIAGNOSIS — Z79899 Other long term (current) drug therapy: Secondary | ICD-10-CM | POA: Diagnosis not present

## 2016-01-28 DIAGNOSIS — I10 Essential (primary) hypertension: Secondary | ICD-10-CM | POA: Insufficient documentation

## 2016-01-28 DIAGNOSIS — Z7984 Long term (current) use of oral hypoglycemic drugs: Secondary | ICD-10-CM | POA: Insufficient documentation

## 2016-01-28 DIAGNOSIS — Z792 Long term (current) use of antibiotics: Secondary | ICD-10-CM | POA: Insufficient documentation

## 2016-01-28 DIAGNOSIS — D649 Anemia, unspecified: Secondary | ICD-10-CM | POA: Insufficient documentation

## 2016-01-28 DIAGNOSIS — R519 Headache, unspecified: Secondary | ICD-10-CM

## 2016-01-28 LAB — CBC
HEMATOCRIT: 32.8 % — AB (ref 36.0–46.0)
HEMOGLOBIN: 9.7 g/dL — AB (ref 12.0–15.0)
MCH: 19.8 pg — ABNORMAL LOW (ref 26.0–34.0)
MCHC: 29.6 g/dL — ABNORMAL LOW (ref 30.0–36.0)
MCV: 66.8 fL — AB (ref 78.0–100.0)
Platelets: 253 10*3/uL (ref 150–400)
RBC: 4.91 MIL/uL (ref 3.87–5.11)
RDW: 17.3 % — AB (ref 11.5–15.5)
WBC: 10.6 10*3/uL — AB (ref 4.0–10.5)

## 2016-01-28 LAB — BASIC METABOLIC PANEL
ANION GAP: 12 (ref 5–15)
BUN: 12 mg/dL (ref 6–20)
CHLORIDE: 102 mmol/L (ref 101–111)
CO2: 24 mmol/L (ref 22–32)
Calcium: 9.2 mg/dL (ref 8.9–10.3)
Creatinine, Ser: 0.9 mg/dL (ref 0.44–1.00)
GFR calc Af Amer: >60 mL/min (ref >60)
GFR calc non Af Amer: >60 mL/min (ref >60)
Glucose, Bld: 109 mg/dL — ABNORMAL HIGH (ref 65–99)
POTASSIUM: 4.2 mmol/L (ref 3.5–5.1)
SODIUM: 138 mmol/L (ref 135–145)

## 2016-01-28 LAB — URINALYSIS, ROUTINE W REFLEX MICROSCOPIC
Bilirubin Urine: NEGATIVE
GLUCOSE, UA: NEGATIVE mg/dL
Hgb urine dipstick: NEGATIVE
KETONES UR: NEGATIVE mg/dL
Leukocytes, UA: NEGATIVE
Nitrite: NEGATIVE
PH: 6 (ref 5.0–8.0)
Protein, ur: NEGATIVE mg/dL
SPECIFIC GRAVITY, URINE: 1.006 (ref 1.005–1.030)

## 2016-01-28 MED ORDER — MECLIZINE HCL 25 MG PO TABS: 25.0000 mg | ORAL_TABLET | Freq: Two times a day (BID) | ORAL | Status: DC

## 2016-01-28 MED ORDER — LISINOPRIL 10 MG PO TABS: 10.0000 mg | ORAL_TABLET | Freq: Every day | ORAL | Status: DC

## 2016-01-28 MED ORDER — KETOROLAC TROMETHAMINE 30 MG/ML IJ SOLN
30.0000 mg | Freq: Once | INTRAMUSCULAR | Status: AC
  Administered 2016-01-28: 30 mg via INTRAVENOUS
  Filled 2016-01-28: qty 1

## 2016-01-28 MED ORDER — LORAZEPAM 1 MG PO TABS: 0.5000 mg | ORAL_TABLET | Freq: Once | ORAL | Status: DC

## 2016-01-28 MED ORDER — MECLIZINE HCL 12.5 MG PO TABS: 12.5000 mg | ORAL_TABLET | Freq: Two times a day (BID) | ORAL | Status: DC

## 2016-01-28 MED ORDER — MECLIZINE HCL 25 MG PO TABS: 12.5000 mg | ORAL_TABLET | Freq: Two times a day (BID) | ORAL | Status: DC

## 2016-01-28 MED ORDER — LISINOPRIL 10 MG PO TABS: 10.0000 mg | ORAL_TABLET | Freq: Two times a day (BID) | ORAL | Status: AC

## 2016-01-28 MED ORDER — SODIUM CHLORIDE 0.9 % IV BOLUS (SEPSIS)
1000.0000 mL | Freq: Once | INTRAVENOUS | Status: AC
  Administered 2016-01-28: 1000 mL via INTRAVENOUS

## 2016-01-28 MED ORDER — MECLIZINE HCL 25 MG PO TABS
25.0000 mg | ORAL_TABLET | Freq: Once | ORAL | Status: AC
  Administered 2016-01-28: 25 mg via ORAL
  Filled 2016-01-28: qty 1

## 2016-01-28 NOTE — ED Provider Notes (Signed)
CSN: [id number]     Arrival date & time 01/28/16  1351 History   First MD Initiated Contact with Patient 01/28/16 1425     Chief Complaint  Patient presents with  . Dizziness     (Consider location/radiation/quality/duration/timing/severity/associated sxs/prior Treatment) HPI Comments: Patient is a 55 year old female with history of hypertension who presents with lightheadedness. The patient has been experiencing this intermittently since February. The patient states she mostly feels lightheaded but has felt some dizziness with some episodes. The patient has been seeing her doctor about this problem. In February prior to these episodes, the patient's PCP switched her from lisinopril 20mg  to lisinopril-HCTZ. The patient began feeling lightheaded after this switch. The PCP took the patient off HCTZ and switched back to lisinopril 20 mg. The patient continued to feel lightheaded following this switch. The PCP then switched the patient to lisinopril 10 mg and the patient has had minimal lightheadedness since this time. The patient took lisinopril 20 mg this morning because she ran out of the center Prill 10 mg. The patient began feeling lightheaded today at work. The patient slept lightheadedness is preceded by a crackling noise the patient hears in her head. Patient also has associated nausea and sweaty feeling in her hands. Patient associates the lightheadedness more so when she stands up or moves around, but it does happen occasionally when sitting. Patient also reports an intermittent frontal headache since February. She has a headache today, and rates the pain as 6/10. Patient took Aleve this morning for her headache with some relief. The patient has tried meclizine for her lightheadedness in the past with some relief, but the patient took it this morning and made the feeling worse. The patient reports that she has had a tingling feeling and decreased sensation on her left side for the past few months.  She mentioned this to her orthopedic doctor who she is seeing for tendinitis of her left ankle. A nerve conduction study is being considered. Patient denies chest pain, shortness of breath, abdominal pain, vomiting, dysuria. The patient is concerned about her headache and lightheadedness, and requested that she get her head scan to make sure she does not have a stroke.  Patient is a 56 y.o. female presenting with dizziness. The history is provided by the patient.  Dizziness Associated symptoms: nausea   Associated symptoms: no chest pain, no headaches, no shortness of breath and no vomiting     Past Medical History  Diagnosis Date  . Tendonitis of ankle, left   . Insulin resistance   . Thalassemia trait   . Anemia   . Carpal tunnel syndrome on both sides   . Frozen shoulder     Left  . Calcaneal spur of foot   . Gastroesophageal reflux disease   . Knee joint pain     Bilateral;  R>L  . Lumbar paraspinal muscle spasm   . Left sided sciatica   . Essential hypertension    Past Surgical History  Procedure Laterality Date  . Total abdominal hysterectomy     Family History  Problem Relation Age of Onset  . Hypertension Mother   . Osteoarthritis Mother   . Anemia Mother   . Hypertension Father   . Diabetes Father   . Hypertension Brother   . Diabetes Brother   . Breast cancer Sister 87  . Lymphoma Maternal Grandmother   . Emphysema Maternal Grandfather   . Colon cancer Other   . Anemia Sister   . Heart attack Paternal  Grandfather    Social History  Substance Use Topics  . Smoking status: Never Smoker   . Smokeless tobacco: Never Used  . Alcohol Use: No   OB History    Gravida Para Term Preterm AB TAB SAB Ectopic Multiple Living   0              Review of Systems  Constitutional: Negative for fever and chills.  HENT: Negative for facial swelling and sore throat.   Respiratory: Negative for shortness of breath.   Cardiovascular: Negative for chest pain.   Gastrointestinal: Positive for nausea. Negative for vomiting and abdominal pain.  Genitourinary: Negative for dysuria.  Musculoskeletal: Negative for back pain.  Skin: Negative for rash and wound.  Neurological: Positive for dizziness and light-headedness. Negative for headaches.  Psychiatric/Behavioral: The patient is not nervous/anxious.       Allergies  Bextra; Morphine and related; Other; Penicillins; Strawberry flavor; and Tobramycin  Home Medications   Prior to Admission medications   Medication Sig Start Date End Date Taking? Authorizing Provider  albuterol (PROVENTIL HFA;VENTOLIN HFA) 108 (90 BASE) MCG/ACT inhaler Inhale 2 puffs into the lungs every 6 (six) hours as needed for wheezing. 10/13/12  Yes Earnstine Regal, PA-C  cholecalciferol (VITAMIN D) 1000 UNITS tablet Take 1,000 Units by mouth daily.   Yes Historical Provider, MD  Cyanocobalamin (VITAMIN B 12 PO) Take by mouth.   Yes Historical Provider, MD  famotidine (PEPCID) 40 MG tablet Take 0.5 tablets (20 mg total) by mouth daily as needed for heartburn. 12/09/12  Yes Earnstine Regal, PA-C  fish oil-omega-3 fatty acids 1000 MG capsule Take 2 g by mouth daily.   Yes Historical Provider, MD  ibuprofen (ADVIL,MOTRIN) 800 MG tablet TAKE 1 TABLET EVERY 8 HOURS AS NEEDED FOR PAIN 04/24/12  Yes Earnstine Regal, PA-C  metFORMIN (GLUCOPHAGE) 500 MG tablet Take 500 mg by mouth 2 (two) times daily with a meal.   Yes Historical Provider, MD  levofloxacin (LEVAQUIN) 750 MG tablet Take 750 mg by mouth daily.    Historical Provider, MD  lisinopril (PRINIVIL,ZESTRIL) 10 MG tablet Take 1 tablet (10 mg total) by mouth 2 (two) times daily. 01/28/16   Frederica Kuster, PA-C  meclizine (ANTIVERT) 25 MG tablet Take 1 tablet (25 mg total) by mouth 2 (two) times daily. 01/28/16   Trista Ciocca M Ravneet Spilker, PA-C   BP 174/96 mmHg  Pulse 86  Temp(Src) 97.8 F (36.6 C) (Oral)  Resp 16  SpO2 98% Physical Exam  Constitutional: She appears well-developed and  well-nourished. No distress.  HENT:  Head: Normocephalic and atraumatic.  Mouth/Throat: Oropharynx is clear and moist. No oropharyngeal exudate.  Eyes: Conjunctivae and EOM are normal. Pupils are equal, round, and reactive to light. Right eye exhibits no discharge. Left eye exhibits no discharge. No scleral icterus.  Neck: Normal range of motion. Neck supple. No thyromegaly present.  Cardiovascular: Normal rate, regular rhythm, normal heart sounds and intact distal pulses.  Exam reveals no gallop and no friction rub.   No murmur heard. Pulmonary/Chest: Effort normal and breath sounds normal. No stridor. No respiratory distress. She has no wheezes. She has no rales.  Abdominal: Soft. Bowel sounds are normal. She exhibits no distension. There is no tenderness. There is no rebound and no guarding.  Musculoskeletal: She exhibits no edema.  Patient has brace on left ankle  Lymphadenopathy:    She has no cervical adenopathy.  Neurological: She is alert. Coordination normal.  Reflex Scores:      Patellar  reflexes are 2+ on the right side and 2+ on the left side. CN 3-12 intact; According to patient, sensation on left side is decreased compared to right; 5/5 strength throughout; normal finger to nose coordination; no nystagmus noted  Skin: Skin is warm and dry. No rash noted. She is not diaphoretic. No pallor.  Psychiatric: She has a normal mood and affect.  Nursing note and vitals reviewed.   ED Course  Procedures (including critical care time) Labs Review Labs Reviewed  BASIC METABOLIC PANEL - Abnormal; Notable for the following:    Glucose, Bld 109 (*)    All other components within normal limits  CBC - Abnormal; Notable for the following:    WBC 10.6 (*)    Hemoglobin 9.7 (*)    HCT 32.8 (*)    MCV 66.8 (*)    MCH 19.8 (*)    MCHC 29.6 (*)    RDW 17.3 (*)    All other components within normal limits  URINALYSIS, ROUTINE W REFLEX MICROSCOPIC (NOT AT [facility])  CBG MONITORING, ED     Imaging Review Ct Head Wo Contrast  01/28/2016  CLINICAL DATA:  Frontal and bitemporal headache.  Lightheadedness. EXAM: CT HEAD WITHOUT CONTRAST TECHNIQUE: Contiguous axial images were obtained from the base of the skull through the vertex without intravenous contrast. COMPARISON:  None. FINDINGS: No acute intracranial abnormality. Specifically, no hemorrhage, hydrocephalus, mass lesion, acute infarction, or significant intracranial injury. No acute calvarial abnormality. Visualized paranasal sinuses and mastoids clear. Orbital soft tissues unremarkable. IMPRESSION: Normal study. Electronically Signed   By: Rolm Baptise M.D.   On: 01/28/2016 16:22   I have personally reviewed and evaluated these images and lab results as part of my medical decision-making.   EKG Interpretation   Date/Time:  Tuesday January 28 2016 14:02:59 EDT Ventricular Rate:  81 PR Interval:  180 QRS Duration: 82 QT Interval:  526 QTC Calculation: 611 R Axis:   24 Text Interpretation:  Sinus rhythm Low voltage, precordial leads  Borderline repolarization abnormality Prolonged QT interval Baseline  wander in lead(s) II III aVR aVL aVF V4 No previous ECGs available  Confirmed by YAO  MD, DAVID ([id number]) on 01/28/2016 2:19:50 PM      MDM   Patient presents with acute on chronic lightheadedness/dizziness and headache. BMP unremarkable. CBC is mostly unchanged from prior studies; patient has history of thalassemia trait. CT shows no acute intracranial abnormality. Neuro exam unremarkable besides some decreased sensation on left side, and she is being worked up by possible nerve conduction study scheduled with orthopedics. Patient symptoms improved with Antivert and Toradol in the ED. Suspect possible BPPV. Uncontrolled blood pressure. Pt to follow up with PCP at scheduled appointment on Thursday for further evaluation of dizziness, lightheadedness, and blood pressure control. Patient discharged with refill of blood pressure  medication and Antivert. Patient also evaluated by Dr. Joelene Millin who is in agreement with plan. Patient understands and is in agreement with plan.  Final diagnoses:  Vertigo  Lightheadedness  Essential hypertension  Nonintractable headache, unspecified chronicity pattern, unspecified headache type       Frederica Kuster, PA-C 01/28/16 1934  Milton Ferguson, MD 01/29/16 0005

## 2016-01-28 NOTE — Discharge Instructions (Signed)
Medications: Lisinopril, Antivert  Treatment: Continue to take lisinopril 10 mg twice daily. Take Antivert 2 times per day. Try and rest for the next few days. You may take ibuprofen or Tylenol for your headaches.  Follow-up: Please follow-up with primary care doctor at your scheduled appointment on Thursday for further evaluation and treatment of your blood pressure, dizziness, and headaches. Please return to the emergency department if you experience any new, different, or worsening symptoms.   Dizziness Dizziness is a common problem. It is a feeling of unsteadiness or light-headedness. You may feel like you are about to faint. Dizziness can lead to injury if you stumble or fall. Anyone can become dizzy, but dizziness is more common in older adults. This condition can be caused by a number of things, including medicines, dehydration, or illness. HOME CARE INSTRUCTIONS Taking these steps may help with your condition: Eating and Drinking  Drink enough fluid to keep your urine clear or pale yellow. This helps to keep you from becoming dehydrated. Try to drink more clear fluids, such as water.  Do not drink alcohol.  Limit your caffeine intake if directed by your health care provider.  Limit your salt intake if directed by your health care provider. Activity  Avoid making quick movements.  Rise slowly from chairs and steady yourself until you feel okay.  In the morning, first sit up on the side of the bed. When you feel okay, stand slowly while you hold onto something until you know that your balance is fine.  Move your legs often if you need to stand in one place for a long time. Tighten and relax your muscles in your legs while you are standing.  Do not drive or operate heavy machinery if you feel dizzy.  Avoid bending down if you feel dizzy. Place items in your home so that they are easy for you to reach without leaning over. Lifestyle  Do not use any tobacco products, including  cigarettes, chewing tobacco, or electronic cigarettes. If you need help quitting, ask your health care provider.  Try to reduce your stress level, such as with yoga or meditation. Talk with your health care provider if you need help. General Instructions  Watch your dizziness for any changes.  Take medicines only as directed by your health care provider. Talk with your health care provider if you think that your dizziness is caused by a medicine that you are taking.  Tell a friend or a family member that you are feeling dizzy. If he or she notices any changes in your behavior, have this person call your health care provider.  Keep all follow-up visits as directed by your health care provider. This is important. SEEK MEDICAL CARE IF:  Your dizziness does not go away.  Your dizziness or light-headedness gets worse.  You feel nauseous.  You have reduced hearing.  You have new symptoms.  You are unsteady on your feet or you feel like the room is spinning. SEEK IMMEDIATE MEDICAL CARE IF:  You vomit or have diarrhea and are unable to eat or drink anything.  You have problems talking, walking, swallowing, or using your arms, hands, or legs.  You feel generally weak.  You are not thinking clearly or you have trouble forming sentences. It may take a friend or family member to notice this.  You have chest pain, abdominal pain, shortness of breath, or sweating.  Your vision changes.  You notice any bleeding.  You have a headache.  You have  neck pain or a stiff neck.  You have a fever.   This information is not intended to replace advice given to you by your health care provider. Make sure you discuss any questions you have with your health care provider.   Document Released: 03/31/2001 Document Revised: 02/19/2015 Document Reviewed: 10/01/2014 Elsevier Interactive Patient Education [employer].

## 2016-01-28 NOTE — ED Notes (Signed)
Per [facility]- Coming from work c/o dizziness that gets worse when she stands up or sits up. Pt c/o frontal HA. Pt c/o nausea, Zofran 4mg  given PTA.

## 2016-02-11 ENCOUNTER — Encounter: Payer: Self-pay | Admitting: Neurology

## 2016-02-11 ENCOUNTER — Ambulatory Visit (INDEPENDENT_AMBULATORY_CARE_PROVIDER_SITE_OTHER): Payer: Managed Care, Other (non HMO) | Admitting: Neurology

## 2016-02-11 VITALS — BP 125/80 | HR 104 | Ht 64.0 in | Wt 341.8 lb

## 2016-02-11 DIAGNOSIS — R42 Dizziness and giddiness: Secondary | ICD-10-CM | POA: Diagnosis not present

## 2016-02-11 NOTE — Patient Instructions (Signed)
I had a long discussion with the patient and husband regarding her intermittent symptoms of lightheadedness and dizziness which are of unclear etiology. Neurological exam is unremarkable except for mild peripheral vestibular dysfunction. I recommend checking MRI scan of the brain with and without contrast and checking a Holter monitor for cardiac arrhythmias. I have also advised her to increase participation in activities for stress laxation like regular exercise, swimming, medication and yoga. I also gave her some dizziness exercises and will refer her to vestibular rehabilitation for dizziness.  She was advised to stay out of work this week but may return to work next week.Allison KitchenShe will return for follow-up in 2 months or call earlier if necessary.  Dizziness Dizziness is a common problem. It is a feeling of unsteadiness or light-headedness. You may feel like you are about to faint. Dizziness can lead to injury if you stumble or fall. Anyone can become dizzy, but dizziness is more common in older adults. This condition can be caused by a number of things, including medicines, dehydration, or illness. HOME CARE INSTRUCTIONS Taking these steps may help with your condition: Eating and Drinking  Drink enough fluid to keep your urine clear or pale yellow. This helps to keep you from becoming dehydrated. Try to drink more clear fluids, such as water.  Do not drink alcohol.  Limit your caffeine intake if directed by your health care provider.  Limit your salt intake if directed by your health care provider. Activity  Avoid making quick movements.  Rise slowly from chairs and steady yourself until you feel okay.  In the morning, first sit up on the side of the bed. When you feel okay, stand slowly while you hold onto something until you know that your balance is fine.  Move your legs often if you need to stand in one place for a long time. Tighten and relax your muscles in your legs while you are  standing.  Do not drive or operate heavy machinery if you feel dizzy.  Avoid bending down if you feel dizzy. Place items in your home so that they are easy for you to reach without leaning over. Lifestyle  Do not use any tobacco products, including cigarettes, chewing tobacco, or electronic cigarettes. If you need help quitting, ask your health care provider.  Try to reduce your stress level, such as with yoga or meditation. Talk with your health care provider if you need help. General Instructions  Watch your dizziness for any changes.  Take medicines only as directed by your health care provider. Talk with your health care provider if you think that your dizziness is caused by a medicine that you are taking.  Tell a friend or a family member that you are feeling dizzy. If he or she notices any changes in your behavior, have this person call your health care provider.  Keep all follow-up visits as directed by your health care provider. This is important. SEEK MEDICAL CARE IF:  Your dizziness does not go away.  Your dizziness or light-headedness gets worse.  You feel nauseous.  You have reduced hearing.  You have new symptoms.  You are unsteady on your feet or you feel like the room is spinning. SEEK IMMEDIATE MEDICAL CARE IF:  You vomit or have diarrhea and are unable to eat or drink anything.  You have problems talking, walking, swallowing, or using your arms, hands, or legs.  You feel generally weak.  You are not thinking clearly or you have trouble forming  sentences. It may take a friend or family member to notice this.  You have chest pain, abdominal pain, shortness of breath, or sweating.  Your vision changes.  You notice any bleeding.  You have a headache.  You have neck pain or a stiff neck.  You have a fever.   This information is not intended to replace advice given to you by your health care provider. Make sure you discuss any questions you have with  your health care provider.   Document Released: 03/31/2001 Document Revised: 02/19/2015 Document Reviewed: 10/01/2014 Elsevier Interactive Patient Education [employer].

## 2016-02-12 NOTE — Progress Notes (Signed)
[facility] [address] [place]. [place] [address] [phone]       OFFICE CONSULT NOTE  Allison Henderson Date of Birth:  [birth year] Medical Record Number:  [id number]   Referring MD:  Hermelinda Medicus  Reason for Referral:  Light headedness HPI: Allison Henderson is a 55 year old Caucasian lady who is in the last 3 months has had intermittent episodes of lightheadedness off and on. He causes dizziness and can occur frequently daily for a few days and then she can go a few weeks without it. She states that this can last for an hour or so. She feels not quite right during this episode and at times feels her hands are clammy but she does not sweat. She denies any palpitations, chest discomfort sensation of fainting or passing out. She states the symptoms began after she was started on hydrochlorothiazide for hypertension. Initially her lisinopril dose was increased as well which she did not tolerate and the dose has since been reduced back to her original dose. She is admits that she is under a lot of stress. 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 When she is having this lightheaded sensation she feels better if she sits down her hands. She was seen by cardiologist Dr. Diona Browner in the past for heart enlargement. She had worn a Holter for a week several years ago but not recently. She denies any other neurological symptoms in the form of loss of vision, blurred vision, diplopia, speech difficulty, extremity weakness, numbness, gait balance problems. her eyes closed for a few minutes and comments. Down. This feeling seems to more prominent if she gets up quickly or turns her head has certain way. This has happened also at times when she is driving and she is scared to move her head. She was seen by Dr. Haroldine Laws yesterday and had a normal ear exam and hearing test was also normal. She denies any ear infection, trauma, ringing sensation in the ears. She had a CT scan of the head done on  01/28/16 which have personally reviewed and is normal. She denies any significant accompanying headache but occasionally she has a funny sensation around the left thigh and face. She needs to grab  ROS:   14 system review of systems is positive for appetite change, fatigue, nausea, joint swelling, joint pain, dizziness, anemia and all other systems negative  PMH:  Past Medical History  Diagnosis Date  . Tendonitis of ankle, left   . Insulin resistance   . Thalassemia trait   . Anemia   . Carpal tunnel syndrome on both sides   . Frozen shoulder     Left  . Calcaneal spur of foot   . Gastroesophageal reflux disease   . Knee joint pain     Bilateral;  R>L  . Lumbar paraspinal muscle spasm   . Left sided sciatica   . Essential hypertension   . Arthritis     Social History:  Social History   Social History  . Marital Status: Married    Spouse Name: N/A  . Number of Children: N/A  . Years of Education: N/A   Occupational History  . Medical Assistant     [employer]   Social History Main Topics  . Smoking status: Never Smoker   . Smokeless tobacco: Never Used  . Alcohol Use: No  . Drug Use: No  . Sexual Activity:    Partners: Male    Birth Control/ Protection: Other-see comments  Comment: NO CYCLES; PT HAD HYST   Other Topics Concern  . Not on file   Social History Narrative    Medications:   Current Outpatient Prescriptions on File Prior to Visit  Medication Sig Dispense Refill  . albuterol (PROVENTIL HFA;VENTOLIN HFA) 108 (90 BASE) MCG/ACT inhaler Inhale 2 puffs into the lungs every 6 (six) hours as needed for wheezing. 1 Inhaler 2  . cholecalciferol (VITAMIN D) 1000 UNITS tablet Take 1,000 Units by mouth daily.    . Cyanocobalamin (VITAMIN B 12 PO) Take by mouth.    . famotidine (PEPCID) 40 MG tablet Take 0.5 tablets (20 mg total) by mouth daily as needed for heartburn. 30 tablet 5  . fish oil-omega-3 fatty acids 1000 MG capsule Take 2 g by mouth  daily.    Marland Kitchen ibuprofen (ADVIL,MOTRIN) 800 MG tablet TAKE 1 TABLET EVERY 8 HOURS AS NEEDED FOR PAIN 90 tablet 3  . lisinopril (PRINIVIL,ZESTRIL) 10 MG tablet Take 1 tablet (10 mg total) by mouth 2 (two) times daily. 30 tablet 0  . meclizine (ANTIVERT) 25 MG tablet Take 1 tablet (25 mg total) by mouth 2 (two) times daily. 30 tablet 0  . metFORMIN (GLUCOPHAGE) 500 MG tablet Take 500 mg by mouth 2 (two) times daily with a meal.     No current facility-administered medications on file prior to visit.    Allergies:   Allergies  Allergen Reactions  . Bextra [Valdecoxib] Hives  . Morphine And Related Itching  . Other     Pt is allergic to many eye drops.   . Penicillins Hives and Swelling    No Ciillins per pt. Has patient had a PCN reaction causing immediate rash, facial/tongue/throat swelling, SOB or lightheadedness with hypotension: No Has patient had a PCN reaction causing severe rash involving mucus membranes or skin necrosis: No Has patient had a PCN reaction that required hospitalization No Has patient had a PCN reaction occurring within the last 10 years: No If all of the above answers are "NO", then may proceed with Cephalosporin use.  Rolland Porter Flavor Itching  . Tobramycin Hives    Physical Exam General: Obese middle-aged Caucasian lady, seated, in no evident distress Head: head normocephalic and atraumatic.   Neck: supple with no carotid or supraclavicular bruits Cardiovascular: regular rate and rhythm, no murmurs Musculoskeletal: no deformity Skin:  no rash/petichiae Vascular:  Normal pulses all extremities  Neurologic Exam Mental Status: Awake and fully alert. Oriented to place and time. Recent and remote memory intact. Attention span, concentration and fund of knowledge appropriate. Mood and affect appropriate.  Cranial Nerves: Fundoscopic exam reveals sharp disc margins. Pupils equal, briskly reactive to light. Extraocular movements full without nystagmus. Visual  fields full to confrontation. Hearing intact. Facial sensation intact. Face, tongue, palate moves normally and symmetrically.  Motor: Normal bulk and tone. Normal strength in all tested extremity muscles. Sensory.: intact to touch , pinprick , position and vibratory sensation.  Coordination: Rapid alternating movements normal in all extremities. Finger-to-nose and heel-to-shin performed accurately bilaterally. Head shaking did not produce any objective nystagmus but she complained of mild dizziness. Fukuda stepping test is positive with patient taking several steps but without rotation. Hallpike maneuver was not done. Gait and Station: Arises from chair without difficulty. Stance is normal. Gait demonstrates normal stride length and balance . Able to heel, toe and tandem walk without difficulty.  Reflexes: 1+ and symmetric. Toes downgoing.       ASSESSMENT: 29 year Caucasian lady with the intermittent  lightheadedness episodes of unclear etiology. Neurological exam and ENT evaluation was fairly benign. She does have mild peripheral vestibular dysfunction  on exam but also admits to significant underlying anxiety and stress    PLAN: I had a long discussion with the patient and husband regarding her intermittent symptoms of lightheadedness and dizziness which are of unclear etiology. Neurological exam is unremarkable except for mild peripheral vestibular dysfunction. I recommend checking MRI scan of the brain with and without contrast and checking a Holter monitor for cardiac arrhythmias. I have also advised her to increase participation in activities for stress laxation like regular exercise, swimming, medication and yoga. I also gave her some dizziness exercises and will refer her to vestibular rehabilitation for dizziness.  She was advised to stay out of work this week but may return to work next week.. Greater than 50% time during this 45 minute consultation was spent on counseling and coordination  of care, and her lightheadedness She will return for follow-up in 2 months or call earlier if necessary.  Delia Heady, MD  [facility] [address] [place], [place] [address]  Phone [phone] Fax [phone]   Note: This document was prepared with digital dictation and possible smart phrase technology. Any transcriptional errors that result from this process are unintentional.

## 2016-02-17 ENCOUNTER — Ambulatory Visit: Payer: Managed Care, Other (non HMO) | Admitting: Rehabilitative and Restorative Service Providers"

## 2016-02-18 ENCOUNTER — Ambulatory Visit: Payer: Managed Care, Other (non HMO) | Attending: Neurology

## 2016-02-18 DIAGNOSIS — R2689 Other abnormalities of gait and mobility: Secondary | ICD-10-CM

## 2016-02-18 DIAGNOSIS — R42 Dizziness and giddiness: Secondary | ICD-10-CM | POA: Diagnosis not present

## 2016-02-18 NOTE — Patient Instructions (Signed)
Gaze Stabilization: Sitting    Keeping eyes on target on wall 3-5 feet away, tilt head down 15-30 and move head side to side for __20__ seconds. Repeat while moving head up and down for _20___ seconds. Do __3__ sessions per day.   Copyright  VHI. All rights reserved.  Gaze Stabilization: Tip Card  1.Target must remain in focus, not blurry, and appear stationary while head is in motion. 2.Perform exercises with small head movements (45 to either side of midline). 3.Increase speed of head motion so long as target is in focus. 4.If you wear eyeglasses, be sure you can see target through lens (therapist will give specific instructions for bifocal / progressive lenses). 5.These exercises may provoke dizziness or nausea. Work through these symptoms. If too dizzy, slow head movement slightly. Rest between each exercise. 6.Exercises demand concentration; avoid distractions. 7.For safety, perform standing exercises close to a counter, wall, corner, or next to someone.  Copyright  VHI. All rights reserved.

## 2016-02-19 NOTE — Therapy (Signed)
[facility] [address] [place], [place], [address] Phone: [phone]   Fax:  [phone]  Physical Therapy Evaluation  Patient Details  Name: Allison Henderson MRN: [id number] Date of Birth: Jan 16, 1961 Referring Provider: Dr. Leonie Man  Encounter Date: 02/18/2016      PT End of Session - 02/19/16 1053    Visit Number 1   Number of Visits 9   Date for PT Re-Evaluation 03/24/16   Authorization Type [employer]-35 days of PT.   PT Start Time [phone]  pt arrived late   PT Stop Time 0933   PT Time Calculation (min) 41 min   Activity Tolerance Patient tolerated treatment well   Behavior During Therapy [facility] for tasks assessed/performed      Past Medical History  Diagnosis Date  . Tendonitis of ankle, left   . Insulin resistance   . Thalassemia trait   . Anemia   . Carpal tunnel syndrome on both sides   . Frozen shoulder     Left  . Calcaneal spur of foot   . Gastroesophageal reflux disease   . Knee joint pain     Bilateral;  R>L  . Lumbar paraspinal muscle spasm   . Left sided sciatica   . Essential hypertension   . Arthritis     Past Surgical History  Procedure Laterality Date  . Total abdominal hysterectomy      There were no vitals filed for this visit.       Subjective Assessment - 02/18/16 0856    Subjective Pt reported dizziness is more lightheadedness vs. spinning sensation. Pt hears a crunching noise prior to onset of lightheadedness. Sitting down and resting decr. sx's and being "active" or stressed incr. sx's. Pt reported sx's began approx. a year ago and frequency incr. in 11/2015. Pt reports lightheadedness is intermittent. At worst: 8/10 and at best: 0/10. Pt reported she also has HAs, she states the HA come and go, she believes they are related to the dizziness but she also has sinus HAs. Pt reported singing at a certain high note brings on lightheaedness. Pt reports she uses SPC for balance, when amb.  longer distances.   Patient is accompained by: Family member  husband: Herbie Baltimore   Pertinent History HTN, anemia, OA, L sided sciatica   Patient Stated Goals To not be so dizzy.   Currently in Pain? Yes   Pain Score 8    Pain Location Hip   Pain Orientation Right   Pain Descriptors / Indicators Aching   Pain Type Chronic pain   Pain Onset More than a month ago   Pain Frequency Intermittent   Aggravating Factors  walking   Pain Relieving Factors rest            [facility] PT Assessment - 02/18/16 0901    Assessment   Medical Diagnosis Episodic lightheadedness and dizziness and giddiness   Referring Provider Dr. Leonie Man   Onset Date/Surgical Date 02/18/15   Hand Dominance Right   Prior Therapy none for dizziness   Precautions   Precautions Fall   Precaution Comments as pt reports near falls which required assist to maintain balance.   Restrictions   Weight Bearing Restrictions No   Balance Screen   Has the patient fallen in the past 6 months No  but had some close calls but husband helped her   Has the patient had a decrease in activity level because of a fear of falling?  Yes   Is  Problem List Patient Active Problem List   Diagnosis Date Noted  . Episodic lightheadedness 02/11/2016  . Dizziness and giddiness 02/11/2016  . Insulin resistance 10/03/2012  . GERD (gastroesophageal reflux disease) 10/03/2012  . Musculoskeletal pain, chronic 10/03/2012  . Sciatica of left side 10/03/2012  . Muscle spasm of back 10/03/2012    Nivan Melendrez L 02/19/2016, 11:06 AM  [facility] [address] [place], [place], [address] Phone: [phone]   Fax:  [phone]  Name: Allison Henderson MRN: [id number] Date of Birth: 04/21/1961   Geoffry Paradise, PT,DPT 02/19/2016 11:06 AM Phone: [phone] Fax: [phone]  Problem List Patient Active Problem List   Diagnosis Date Noted  . Episodic lightheadedness 02/11/2016  . Dizziness and giddiness 02/11/2016  . Insulin resistance 10/03/2012  . GERD (gastroesophageal reflux disease) 10/03/2012  . Musculoskeletal pain, chronic 10/03/2012  . Sciatica of left side 10/03/2012  . Muscle spasm of back 10/03/2012    Nivan Melendrez L 02/19/2016, 11:06 AM  [facility] [address] [place], [place], [address] Phone: [phone]   Fax:  [phone]  Name: Allison Henderson MRN: [id number] Date of Birth: 04/21/1961   Geoffry Paradise, PT,DPT 02/19/2016 11:06 AM Phone: [phone] Fax: [phone]  [facility] [address] [place], [place], [address] Phone: [phone]   Fax:  [phone]  Physical Therapy Evaluation  Patient Details  Name: Allison Henderson MRN: [id number] Date of Birth: Jan 16, 1961 Referring Provider: Dr. Leonie Man  Encounter Date: 02/18/2016      PT End of Session - 02/19/16 1053    Visit Number 1   Number of Visits 9   Date for PT Re-Evaluation 03/24/16   Authorization Type [employer]-35 days of PT.   PT Start Time [phone]  pt arrived late   PT Stop Time 0933   PT Time Calculation (min) 41 min   Activity Tolerance Patient tolerated treatment well   Behavior During Therapy [facility] for tasks assessed/performed      Past Medical History  Diagnosis Date  . Tendonitis of ankle, left   . Insulin resistance   . Thalassemia trait   . Anemia   . Carpal tunnel syndrome on both sides   . Frozen shoulder     Left  . Calcaneal spur of foot   . Gastroesophageal reflux disease   . Knee joint pain     Bilateral;  R>L  . Lumbar paraspinal muscle spasm   . Left sided sciatica   . Essential hypertension   . Arthritis     Past Surgical History  Procedure Laterality Date  . Total abdominal hysterectomy      There were no vitals filed for this visit.       Subjective Assessment - 02/18/16 0856    Subjective Pt reported dizziness is more lightheadedness vs. spinning sensation. Pt hears a crunching noise prior to onset of lightheadedness. Sitting down and resting decr. sx's and being "active" or stressed incr. sx's. Pt reported sx's began approx. a year ago and frequency incr. in 11/2015. Pt reports lightheadedness is intermittent. At worst: 8/10 and at best: 0/10. Pt reported she also has HAs, she states the HA come and go, she believes they are related to the dizziness but she also has sinus HAs. Pt reported singing at a certain high note brings on lightheaedness. Pt reports she uses SPC for balance, when amb.  longer distances.   Patient is accompained by: Family member  husband: Herbie Baltimore   Pertinent History HTN, anemia, OA, L sided sciatica   Patient Stated Goals To not be so dizzy.   Currently in Pain? Yes   Pain Score 8    Pain Location Hip   Pain Orientation Right   Pain Descriptors / Indicators Aching   Pain Type Chronic pain   Pain Onset More than a month ago   Pain Frequency Intermittent   Aggravating Factors  walking   Pain Relieving Factors rest            [facility] PT Assessment - 02/18/16 0901    Assessment   Medical Diagnosis Episodic lightheadedness and dizziness and giddiness   Referring Provider Dr. Leonie Man   Onset Date/Surgical Date 02/18/15   Hand Dominance Right   Prior Therapy none for dizziness   Precautions   Precautions Fall   Precaution Comments as pt reports near falls which required assist to maintain balance.   Restrictions   Weight Bearing Restrictions No   Balance Screen   Has the patient fallen in the past 6 months No  but had some close calls but husband helped her   Has the patient had a decrease in activity level because of a fear of falling?  Yes   Is

## 2016-02-20 ENCOUNTER — Inpatient Hospital Stay: Admission: RE | Admit: 2016-02-20 | Payer: Managed Care, Other (non HMO) | Source: Ambulatory Visit

## 2016-02-23 ENCOUNTER — Ambulatory Visit
Admission: RE | Admit: 2016-02-23 | Discharge: 2016-02-23 | Disposition: A | Discharge status: Home / Self Care | Payer: Managed Care, Other (non HMO) | Source: Ambulatory Visit | Attending: Neurology | Admitting: Neurology

## 2016-02-23 DIAGNOSIS — R42 Dizziness and giddiness: Secondary | ICD-10-CM | POA: Diagnosis not present

## 2016-02-23 MED ORDER — GADOBENATE DIMEGLUMINE 529 MG/ML IV SOLN
20.0000 mL | Freq: Once | INTRAVENOUS | Status: AC | PRN
  Administered 2016-02-23: 20 mL via INTRAVENOUS

## 2016-02-24 ENCOUNTER — Telehealth: Payer: Self-pay | Admitting: Neurology

## 2016-02-24 NOTE — Telephone Encounter (Signed)
Patient is calling and states she would like to have the resuilts to her MRI done [facility]  yesterday.  Also, she is having OP therapy and would like to know if she can now drive as her job is in [place] and she lives in [place].  Please call.

## 2016-02-25 ENCOUNTER — Ambulatory Visit: Payer: Managed Care, Other (non HMO)

## 2016-02-25 ENCOUNTER — Telehealth: Payer: Self-pay | Admitting: Neurology

## 2016-02-25 DIAGNOSIS — R42 Dizziness and giddiness: Secondary | ICD-10-CM | POA: Diagnosis not present

## 2016-02-25 DIAGNOSIS — R2689 Other abnormalities of gait and mobility: Secondary | ICD-10-CM

## 2016-02-25 NOTE — Telephone Encounter (Signed)
Patient is calling to follow up on a referral for a heart monitor. Please call and advise patient at [phone] Ext. [phone].

## 2016-02-25 NOTE — Telephone Encounter (Signed)
LVM for pt on home and work number. Gave [facility] phone number for her to call back. Please relay Dr Clydene Fake message below.

## 2016-02-25 NOTE — Therapy (Signed)
[facility] [address] [place], [place], [address] Phone: [phone]   Fax:  [phone]  Physical Therapy Treatment  Patient Details  Name: Allison Henderson MRN: [id number] Date of Birth: September 20, 1961 Referring Provider: Dr. Pearlean Brownie  Encounter Date: 02/25/2016      PT End of Session - 02/25/16 1228    Visit Number 2   Number of Visits 9   Date for PT Re-Evaluation 03/24/16   Authorization Type C[employer]-35 days of PT.   PT Start Time [phone]   PT Stop Time 1012   PT Time Calculation (min) 38 min   Activity Tolerance Treatment limited secondary to medical complications (Comment)   Behavior During Therapy [facility] for tasks assessed/performed      Past Medical History  Diagnosis Date  . Tendonitis of ankle, left   . Insulin resistance   . Thalassemia trait   . Anemia   . Carpal tunnel syndrome on both sides   . Frozen shoulder     Left  . Calcaneal spur of foot   . Gastroesophageal reflux disease   . Knee joint pain     Bilateral;  R>Henderson  . Lumbar paraspinal muscle spasm   . Left sided sciatica   . Essential hypertension   . Arthritis     Past Surgical History  Procedure Laterality Date  . Total abdominal hysterectomy      There were no vitals filed for this visit.      Subjective Assessment - 02/25/16 0938    Subjective Pt denied falls since last visit. However, pt reported lightheadedness when she gets up too fast. Pt reported she gets a littel dizzy/nauseated when performing HEP.   Patient is accompained by: Family member   Pertinent History HTN, anemia, OA, Henderson sided sciatica   Patient Stated Goals To not be so dizzy.   Currently in Pain? Yes   Pain Score 5    Pain Location Hip   Pain Orientation Right   Pain Descriptors / Indicators Aching   Pain Type Chronic pain   Pain Onset More than a month ago   Pain Frequency Intermittent   Aggravating Factors  walking   Pain Relieving Factors rest            [facility] PT Assessment - 02/25/16 1001    Functional Gait  Assessment   Gait assessed  Yes   Gait Level Surface Walks 20 ft, slow speed, abnormal gait pattern, evidence for imbalance or deviates 10-15 in outside of the 12 in walkway width. Requires more than 7 sec to ambulate 20 ft.  12.9sec.   Steps Two feet to a stair, must use rail.   FGA comment: Ceased as pt reported Henderson sided face N/T and crunches noises in ear and BP elevated at rest.            Vestibular Assessment - 02/25/16 0940    Other Tests   Tragal Negative   Comments Clapping: negative   Orthostatics   BP supine (x 5 minutes) 126/92 mmHg   HR supine (x 5 minutes) 90   BP sitting 136/93 mmHg   HR sitting 87   BP standing (after 1 minute) 151/101 mmHg   HR standing (after 1 minute) 98   Orthostatics Comment No lightheadedness or dizziness reported. PT assessed BP manually: 152/98.     PT had pt take rest breaks to allow BP to decr. Prior to performing standing activities.  Self Care:     PT Education - 02/25/16 1225    Education provided Yes   Education Details PT discussed continuing gaze stab. HEP and to decr. either head ROM or speed if dizziness/nausea >/=5-6/10. PT discussed elevated BP and asked pt to f/u with MD regarding a Holter monitor which pt stated he had ordered. Pt stated she planned to stop by his office anyway to check on MRI results. 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 However, PT informed pt that therapy would treat vestibular system only and notify MD of findings, as it is not within our scope of practice to diagnosis.    Person(s) Educated Patient;Spouse   Methods Explanation   Comprehension Verbalized understanding          PT Short Term Goals - 02/19/16 1100    PT SHORT TERM GOAL #1   Title Same as LTGs           PT Long Term Goals - 02/25/16  1320    PT LONG TERM GOAL #1   Title Pt will be IND in HEP to improve dizziness and balance. Target date: 03/17/16   Status On-going   PT LONG TERM GOAL #2   Title Pt will amb. 600' over even/uneven terrain IND while performing head turns with dizziness not incr. by >/=2 points in order to improve functional mobility. Target date: 03/17/16   Status On-going   PT LONG TERM GOAL #3   Title Pt will improve gait speed to >/=2.81ft/sec. with no AD to safely amb. in community. Target date: 03/17/16   Status On-going   PT LONG TERM GOAL #4   Title Perform FGA and write goal. Target date: 03/17/16   Status On-going   PT LONG TERM GOAL #5   Title Pt will report dizziness at worst </=2/10 in order to safely perform ADLs and work duties. Target date: 03/17/16   Status On-going   PT LONG TERM GOAL #6   Title Pt will improve DHI score to </=52% to improve quality of life. Target date: 03/17/16   Status On-going               Plan - 02/25/16 1228    Clinical Impression Statement Pt's BP became elevated during orthostatic testing and was negative for orthostatics and lightheadedness. Pt unable to complete FGA at end of session 2/2 pt reported N/T on Henderson side of face and reports this can occur with exertion. Pt's sx's appear to be multi-factorial and PT will continue to treat vestibular sx's and notify MD of changes. Pt's frequency to 1x/week as pt relies on her husband for transportation.   Rehab Potential Good   Clinical Impairments Affecting Rehab Potential co-morbidities   PT Frequency 2x / week   PT Duration 4 weeks   PT Treatment/Interventions ADLs/Self Care Home Management;Biofeedback;Canalith Repostioning;Balance training;Manual techniques;Vestibular;Therapeutic exercise;Therapeutic activities;Functional mobility training;Stair training;Gait training;DME Instruction;Patient/family education;Neuromuscular re-education   PT Next Visit Plan FGA and SOT   PT Home Exercise Plan Gaze Stab. HEP    Consulted and Agree with Plan of Care Patient;Family member/caregiver   Family Member Consulted pt's husband: Molly Maduro      Patient will benefit from skilled therapeutic intervention in order to improve the following deficits and impairments:  Postural dysfunction, Impaired flexibility, Dizziness, Decreased balance, Decreased mobility, Decreased activity tolerance, Decreased knowledge of use of DME, Decreased strength, Obesity, Abnormal gait  Visit Diagnosis: Dizziness and giddiness  Other abnormalities of gait and mobility     Problem List Patient Active Problem List   Diagnosis Date Noted  . Episodic lightheadedness 02/11/2016  . Dizziness and giddiness 02/11/2016  . Insulin resistance 10/03/2012  . GERD (gastroesophageal reflux disease) 10/03/2012  . Musculoskeletal pain, chronic 10/03/2012  . Sciatica of left side 10/03/2012  . Muscle spasm of back 10/03/2012    Allison Henderson 02/25/2016, 1:20 PM  [facility] [address] [place], [place], [address] Phone: [phone]   Fax:  [phone]  Name: Allison Henderson MRN: [id number] Date of Birth: 1961/03/06    Zerita Boers, PT,DPT 02/25/2016 1:21 PM Phone: [phone] Fax: [phone]

## 2016-02-25 NOTE — Telephone Encounter (Signed)
I advised patient per notes below that her MRI was unremarkable and no worrisome findings.

## 2016-02-25 NOTE — Telephone Encounter (Signed)
Kindly inform patient that brain MRI is unremarkable and no worrisome findings. She can drive if she feels ok.keep f/u appointment

## 2016-02-26 ENCOUNTER — Other Ambulatory Visit: Payer: Self-pay | Admitting: Neurology

## 2016-02-26 ENCOUNTER — Other Ambulatory Visit: Payer: Self-pay

## 2016-02-26 ENCOUNTER — Telehealth: Payer: Self-pay | Admitting: *Deleted

## 2016-02-26 DIAGNOSIS — R42 Dizziness and giddiness: Secondary | ICD-10-CM

## 2016-02-26 NOTE — Telephone Encounter (Signed)
Pt called, has a question about her appt with Maryanna Shape. Please call. [phone] ext [phone]

## 2016-02-26 NOTE — Telephone Encounter (Signed)
Called and left patient a message apt 03/02/2016 at 3:30 at [facility] if patient needs to reschedule please call [phone] .

## 2016-02-26 NOTE — Telephone Encounter (Signed)
Rn call patient and she wants her holter monitor order change to [facility]. Pt states her cardiologist works there. Pt is schedule for [facility]  On Monday. Rn stated order was put in for [facility]. Rn requested patient to call [facility] to cancel that appt. Pt verbalized understanding.

## 2016-02-26 NOTE — Telephone Encounter (Signed)
Pt called said she is wanting have an with her cardiology office. Please call her at [phone] x [phone] This message was sent via answering service.

## 2016-02-26 NOTE — Telephone Encounter (Signed)
LFT vm that per Dr.Sethi she can drive if she feels okay with doing it, and her MRI was normal. See Dr.Sethi note from may 9.

## 2016-02-27 ENCOUNTER — Encounter: Payer: Managed Care, Other (non HMO) | Admitting: Physical Therapy

## 2016-02-27 NOTE — Telephone Encounter (Signed)
Patient has to go to [employer] [facility] [employer]  do to Holter monitor, Patient understands all details and she will be at her apt   03/02/2016.

## 2016-03-02 ENCOUNTER — Ambulatory Visit (INDEPENDENT_AMBULATORY_CARE_PROVIDER_SITE_OTHER): Payer: Managed Care, Other (non HMO)

## 2016-03-02 DIAGNOSIS — R42 Dizziness and giddiness: Secondary | ICD-10-CM

## 2016-03-06 ENCOUNTER — Ambulatory Visit: Payer: Managed Care, Other (non HMO) | Admitting: Physical Therapy

## 2016-03-10 ENCOUNTER — Ambulatory Visit: Payer: Managed Care, Other (non HMO) | Admitting: Physical Therapy

## 2016-03-10 VITALS — BP 119/80

## 2016-03-10 DIAGNOSIS — R2689 Other abnormalities of gait and mobility: Secondary | ICD-10-CM

## 2016-03-10 DIAGNOSIS — R42 Dizziness and giddiness: Secondary | ICD-10-CM

## 2016-03-10 NOTE — Therapy (Signed)
HEP to improve dizziness and balance. Target date: 03/17/16   Status On-going   PT LONG TERM GOAL #2   Title Pt will amb. 600' over even/uneven terrain IND while performing head turns with dizziness not incr. by >/=2 points in order to improve functional mobility. Target date: 03/17/16   Status On-going   PT LONG TERM GOAL #3   Title Pt will improve gait speed to >/=2.81ft/sec. with no AD to safely amb. in community. Target date: 03/17/16   Status On-going   PT LONG TERM GOAL #4   Title Perform FGA and write goal. Target date: 03/17/16   Status On-going   PT LONG TERM GOAL #5   Title Pt will report dizziness at worst </=2/10 in order to safely perform ADLs and work duties. Target date: 03/17/16   Status On-going   PT LONG TERM GOAL #6   Title Pt will improve DHI score to </=52% to improve quality of life. Target date: 03/17/16   Status On-going               Plan - 03/10/16 0917    Clinical Impression Statement Pt scored above avg on all systems and composite score on SOT indicating no vestibular problems with balance.  Feel most of pt's  symptoms at this time may be due to other medical conditions rather than true vestibular dysfunction.  Notified primary PT who will further discuss with pt at next session.   PT Next Visit Plan anticipate d/c next visit   PT Home Exercise Plan Gaze Stab. HEP   Consulted and Agree with Plan of Care Patient      Patient will benefit from skilled therapeutic intervention in order to improve the following deficits and impairments:     Visit Diagnosis: Dizziness and giddiness  Other abnormalities of gait and mobility     Problem List Patient Active Problem List   Diagnosis Date Noted  . Episodic lightheadedness 02/11/2016  . Dizziness and giddiness 02/11/2016  . Insulin resistance 10/03/2012  . GERD (gastroesophageal reflux disease) 10/03/2012  . Musculoskeletal pain, chronic 10/03/2012  . Sciatica of left side 10/03/2012  . Muscle spasm of back 10/03/2012   Laureen Abrahams, PT, DPT 03/10/2016 9:20 AM  [facility] [address] [place], [place], [address] Phone: [phone]   Fax:  [phone]  Name: Allison Henderson MRN: [id number] Date of Birth: 15-Mar-1961  [facility] [address] [place], [place], [address] Phone: [phone]   Fax:  [phone]  Physical Therapy Treatment  Patient Details  Name: Allison Henderson MRN: [id number] Date of Birth: 07-Nov-1960 Referring Provider: Dr. Leonie Man  Encounter Date: 03/10/2016      PT End of Session - 03/10/16 0916    Visit Number 3   Number of Visits 9   Date for PT Re-Evaluation 03/24/16   Authorization Type [employer]-35 days of PT.   PT Start Time [phone]  pt arrived late to session   PT Stop Time 0829   PT Time Calculation (min) 30 min   Activity Tolerance Patient tolerated treatment well   Behavior During Therapy [facility] for tasks assessed/performed      Past Medical History  Diagnosis Date  . Tendonitis of ankle, left   . Insulin resistance   . Thalassemia trait   . Anemia   . Carpal tunnel syndrome on both sides   . Frozen shoulder     Left  . Calcaneal spur of foot   . Gastroesophageal reflux disease   . Knee joint pain     Bilateral;  R>L  . Lumbar paraspinal muscle spasm   . Left sided sciatica   . Essential hypertension   . Arthritis     Past Surgical History  Procedure Laterality Date  . Total abdominal hysterectomy      Filed Vitals:   03/10/16 0802  BP: 119/80        Subjective Assessment - 03/10/16 0801    Subjective doing okay; episodes not as severe but still can't "get up and take off."  wore holter monitor x 48 hours-awaiting results   Patient is accompained by: Family member   Pertinent History HTN, anemia, OA, L sided sciatica   Patient Stated Goals To not be so dizzy.   Currently in Pain? Yes   Pain Score 6    Pain Location Knee   Pain Orientation Left;Right   Pain Descriptors / Indicators Aching   Pain Type Chronic pain   Pain Onset More than a month ago   Pain Frequency Intermittent   Aggravating Factors  walking   Pain Relieving Factors rest         Neuro re-ed: sensory organization  test performed with following results: Conditions: 1:  WNL 2:  WNL (above avg 1 and 2; slightly below 3) 3:  WNL (above avg 1 and 3; slightly below 2) 4:WNL (above avg) 5: above avg 6: abpve avg Composite score:  80 (avg ~70) Sensory Analysis Som: WNL [id number] avg Vest:above avg (pt scored > 75; avg ~ 55) pref:WNL Strategy analysis:      WNL; mildly hip dominant on condition 6 COG alignment:      WNL                           PT Education - 03/10/16 0916    Education provided Yes   Education Details results of SOT   Person(s) Educated Patient   Methods Explanation;Handout   Comprehension Verbalized understanding          PT Short Term Goals - 02/19/16 1100    PT SHORT TERM GOAL #1   Title Same as LTGs           PT Long Term Goals - 02/25/16 1320    PT LONG TERM GOAL #1   Title Pt will be IND in

## 2016-03-13 ENCOUNTER — Encounter: Payer: Managed Care, Other (non HMO) | Admitting: Physical Therapy

## 2016-03-18 ENCOUNTER — Ambulatory Visit: Payer: Managed Care, Other (non HMO)

## 2016-03-18 VITALS — BP 130/98 | HR 85

## 2016-03-18 DIAGNOSIS — R42 Dizziness and giddiness: Secondary | ICD-10-CM

## 2016-03-18 DIAGNOSIS — R2689 Other abnormalities of gait and mobility: Secondary | ICD-10-CM

## 2016-03-18 NOTE — Therapy (Addendum)
that pt should f/u with MD regarding wooziness. PT also educated pt that dialstolic BP is still elevated and to notify MD. Pt reported chest pain the other day at work when stressed, PT educated pt on the importance of informing MD.   Person(s) Educated Patient   Methods Explanation   Comprehension Verbalized understanding     [facility]: 22% (pt completed between 0830 and 0840).      PT Short Term Goals - 02/19/16 1100    PT SHORT TERM GOAL #1   Title Same as LTGs           PT Long Term Goals - 03/18/16 0835    PT LONG TERM GOAL #1   Title Pt will be IND in HEP to improve dizziness and balance. Target date: 03/17/16   Status Achieved   PT LONG TERM GOAL #2   Title Pt will amb. 600' over even/uneven terrain IND while performing head turns with dizziness not incr. by >/=2 points in order to improve functional  mobility. Target date: 03/17/16   Status Achieved   PT LONG TERM GOAL #3   Title Pt will improve gait speed to >/=2.52f/sec. with no AD to safely amb. in community. Target date: 03/17/16   Status Achieved   PT LONG TERM GOAL #4   Title Perform FGA and write goal. Target date: 03/17/16   Status Deferred  based on SOT findings   PT LONG TERM GOAL #5   Title Pt will report dizziness at worst </=2/10 in order to safely perform ADLs and work duties. Target date: 03/17/16   Status Achieved   PT LONG TERM GOAL #6   Title Pt will improve DHI score to </=52% to improve quality of life. Target date: 03/17/16   Status Achieved               Plan - 03/18/16 0835    Clinical Impression Statement Pt met all LTGs. Please see d/c summary for details. Pt's ongoing sx's of wooziness may be due to other medical conditions, as pt met goals and based on SOT findings.      Patient will benefit from skilled therapeutic intervention in order to improve the following deficits and impairments:     Visit Diagnosis: Dizziness and giddiness  Other abnormalities of gait and mobility     Problem List Patient Active Problem List   Diagnosis Date Noted  . Episodic lightheadedness 02/11/2016  . Dizziness and giddiness 02/11/2016  . Insulin resistance 10/03/2012  . GERD (gastroesophageal reflux disease) 10/03/2012  . Musculoskeletal pain, chronic 10/03/2012  . Sciatica of left side 10/03/2012  . Muscle spasm of back 10/03/2012    Roald Lukacs L 03/18/2016, 8:41 AM  [id number] [address]: [phone]  Fax:  [phone] Name: Allison DUDGEONMRN: [id number] of Birth: 104-30-1962 PHYSICAL THERAPY DISCHARGE SUMMARY  Visits from Start of Care: 4  Current functional level related to goals / functional outcomes:     PT Long Term Goals - 03/18/16 0835    PT LONG TERM GOAL #1   Title Pt  will be IND in HEP to improve dizziness and balance. Target date: 03/17/16   Status Achieved   PT LONG TERM GOAL #2   Title Pt will amb. 600' over even/uneven terrain IND while performing head turns with dizziness not incr. by >/=2 points in order to improve functional mobility. Target date: 03/17/16   Status Achieved  [facility] [address] [place], [place], [address] Phone: [phone]   Fax:  [phone]  Physical Therapy Treatment  Patient Details  Name: Allison Henderson MRN: [id number] Date of Birth: 1961/06/03 Referring Provider: Dr. Leonie Man  Encounter Date: 03/18/2016      PT End of Session - 03/18/16 0834    Visit Number 4   Number of Visits 9   Date for PT Re-Evaluation 03/24/16   Authorization Type [employer]-35 days of PT.   PT Start Time 0806   PT Stop Time 0830   PT Time Calculation (min) 24 min   Activity Tolerance Patient tolerated treatment well   Behavior During Therapy [facility] for tasks assessed/performed      Past Medical History  Diagnosis Date  . Tendonitis of ankle, left   . Insulin resistance   . Thalassemia trait   . Anemia   . Carpal tunnel syndrome on both sides   . Frozen shoulder     Left  . Calcaneal spur of foot   . Gastroesophageal reflux disease   . Knee joint pain     Bilateral;  R>L  . Lumbar paraspinal muscle spasm   . Left sided sciatica   . Essential hypertension   . Arthritis     Past Surgical History  Procedure Laterality Date  . Total abdominal hysterectomy      Filed Vitals:   03/18/16 0808  BP: 130/98  Pulse: 85        Subjective Assessment - 03/18/16 0808    Subjective Pt denied falls or changes since last visit. Pt reported she feels a little better (dizziness). Pt reported she has a slight HA today but feels it is sinus related. Pt states 0/10 dizziness but 5/10 wooziness upon standing quickly. Pt reports wooziness is better if she stands slowly.   Pertinent History HTN, anemia, OA, L sided sciatica   Patient Stated Goals To not be so dizzy.   Currently in Pain? Yes   Pain Score 5    Pain Location Head   Pain Orientation Left   Pain Descriptors / Indicators Headache   Pain Type Acute pain   Pain Onset Today   Pain Frequency Intermittent   Aggravating Factors  nothing    Pain Relieving Factors Medication                         [facility] Adult PT Treatment/Exercise - 03/18/16 0737    Ambulation/Gait   Ambulation/Gait Yes   Ambulation/Gait Assistance 7: Independent   Ambulation/Gait Assistance Details No overt LOB. Pt reported she did not feel dizzy but a little "off" when looking to the L side. Pt reported HA decr. to 2/10 after Flonase "kicked in".  Pt performed head turns while amb.   Ambulation Distance (Feet) 600 Feet  and 75'   Assistive device None   Gait Pattern Step-through pattern;Decreased stride length;Decreased stance time - right;Antalgic;Decreased trunk rotation  Antalgic 2/2 R hip pain after amb. this weekend   Ambulation Surface Level;Unlevel;Indoor;Outdoor;Paved   Gait velocity 3.9f/sec.           Self Care:     PT Education - 03/18/16 [phone]   Education provided Yes   Education Details PT discussed goal progress and d/c, as pt met goals. Pt continues to feels wooziness but not dizziness, PT educated pt that wooziness is likely not due to vestibular issues based on SOT findings and pt's progess and  [facility] [address] [place], [place], [address] Phone: [phone]   Fax:  [phone]  Physical Therapy Treatment  Patient Details  Name: Allison Henderson MRN: [id number] Date of Birth: 1961/06/03 Referring Provider: Dr. Leonie Man  Encounter Date: 03/18/2016      PT End of Session - 03/18/16 0834    Visit Number 4   Number of Visits 9   Date for PT Re-Evaluation 03/24/16   Authorization Type C[employer]-35 days of PT.   PT Start Time 0806   PT Stop Time 0830   PT Time Calculation (min) 24 min   Activity Tolerance Patient tolerated treatment well   Behavior During Therapy [facility] for tasks assessed/performed      Past Medical History  Diagnosis Date  . Tendonitis of ankle, left   . Insulin resistance   . Thalassemia trait   . Anemia   . Carpal tunnel syndrome on both sides   . Frozen shoulder     Left  . Calcaneal spur of foot   . Gastroesophageal reflux disease   . Knee joint pain     Bilateral;  R>L  . Lumbar paraspinal muscle spasm   . Left sided sciatica   . Essential hypertension   . Arthritis     Past Surgical History  Procedure Laterality Date  . Total abdominal hysterectomy      Filed Vitals:   03/18/16 0808  BP: 130/98  Pulse: 85        Subjective Assessment - 03/18/16 0808    Subjective Pt denied falls or changes since last visit. Pt reported she feels a little better (dizziness). Pt reported she has a slight HA today but feels it is sinus related. Pt states 0/10 dizziness but 5/10 wooziness upon standing quickly. Pt reports wooziness is better if she stands slowly.   Pertinent History HTN, anemia, OA, L sided sciatica   Patient Stated Goals To not be so dizzy.   Currently in Pain? Yes   Pain Score 5    Pain Location Head   Pain Orientation Left   Pain Descriptors / Indicators Headache   Pain Type Acute pain   Pain Onset Today   Pain Frequency Intermittent   Aggravating Factors  nothing    Pain Relieving Factors Medication                         [facility] Adult PT Treatment/Exercise - 03/18/16 0737    Ambulation/Gait   Ambulation/Gait Yes   Ambulation/Gait Assistance 7: Independent   Ambulation/Gait Assistance Details No overt LOB. Pt reported she did not feel dizzy but a little "off" when looking to the L side. Pt reported HA decr. to 2/10 after Flonase "kicked in".  Pt performed head turns while amb.   Ambulation Distance (Feet) 600 Feet  and 75'   Assistive device None   Gait Pattern Step-through pattern;Decreased stride length;Decreased stance time - right;Antalgic;Decreased trunk rotation  Antalgic 2/2 R hip pain after amb. this weekend   Ambulation Surface Level;Unlevel;Indoor;Outdoor;Paved   Gait velocity 3.9f/sec.           Self Care:     PT Education - 03/18/16 [phone]   Education provided Yes   Education Details PT discussed goal progress and d/c, as pt met goals. Pt continues to feels wooziness but not dizziness, PT educated pt that wooziness is likely not due to vestibular issues based on SOT findings and pt's progess and

## 2016-04-06 ENCOUNTER — Telehealth: Payer: Self-pay

## 2016-04-06 NOTE — Telephone Encounter (Signed)
LFt vm for patient to call back about 48 hour holter monitor.

## 2016-04-06 NOTE — Telephone Encounter (Signed)
Rn call patient that 48 hr holter monitor did not reveal any rhythm problems. Pt verbalized understanding. ------

## 2016-04-06 NOTE — Telephone Encounter (Signed)
-----   Message from Garvin Fila, MD sent at 04/04/2016  5:09 PM EDT ----- Allison Henderson inform patient that 48 hr holter monitor did not reveal any significant rhythm problems

## 2016-05-04 ENCOUNTER — Ambulatory Visit: Payer: Managed Care, Other (non HMO) | Admitting: Neurology

## 2016-05-18 ENCOUNTER — Encounter: Payer: Self-pay | Admitting: Neurology

## 2016-05-18 ENCOUNTER — Ambulatory Visit (INDEPENDENT_AMBULATORY_CARE_PROVIDER_SITE_OTHER): Payer: Managed Care, Other (non HMO) | Admitting: Neurology

## 2016-05-18 VITALS — BP 114/70 | HR 83 | Ht 64.0 in | Wt 331.2 lb

## 2016-05-18 DIAGNOSIS — R42 Dizziness and giddiness: Secondary | ICD-10-CM

## 2016-05-18 DIAGNOSIS — E237 Disorder of pituitary gland, unspecified: Secondary | ICD-10-CM

## 2016-05-18 NOTE — Patient Instructions (Addendum)
I had a long discussion with the patient regarding her symptoms of intermittent lightheadedness and dizziness which seem to have improved. 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 She does not have significant papilledema, headaches or vision complaints to indicate benign intracranial hypertension at the present time. I encouraged her again to increase participation in activities for stress laxation like exercise, swimming, medication and yoga. She will return for follow-up in 6 months with nurse practitioner or earlier if she has headaches or vision symptoms.

## 2016-05-18 NOTE — Progress Notes (Signed)
[facility] [address] [place]. [place] [address] [phone]       OFFICE FOLLOW UP VISIT  NOTE  Allison. Allison Henderson Date of Birth:  [birth year] Medical Record Number:  [id number]   Referring MD:  Hermelinda Medicus  Reason for Referral:  Light headedness HPI: Initial Consult  02/12/2016 ;Allison Henderson is a 55 year old Caucasian lady who is in the last 3 months has had intermittent episodes of lightheadedness off and on. He causes dizziness and can occur frequently daily for a few days and then she can go a few weeks without it. She states that this can last for an hour or so. She feels not quite right during this episode and at times feels her hands are clammy but she does not sweat. She denies any palpitations, chest discomfort sensation of fainting or passing out. She states the symptoms began after she was started on hydrochlorothiazide for hypertension. Initially her lisinopril dose was increased as well which she did not tolerate and the dose has since been reduced back to her original dose. She is admits that she is under a lot of stress. 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 When she is having this lightheaded sensation she feels better if she sits down her hands. She was seen by cardiologist Dr. Diona Browner in the past for heart enlargement. She had worn a Holter for a week several years ago but not recently. She denies any other neurological symptoms in the form of loss of vision, blurred vision, diplopia, speech difficulty, extremity weakness, numbness, gait balance problems. her eyes closed for a few minutes and comments. Down. This feeling seems to more prominent if she gets up quickly or turns her head has certain way. This has happened also at times when she is driving and she is scared to move her head. She was seen by Dr. Haroldine Laws yesterday and had a normal ear exam and hearing test was also normal. She denies any ear infection, trauma, ringing sensation in the ears.  She had a CT scan of the head done on 01/28/16 which have personally reviewed and is normal. She denies any significant accompanying headache but occasionally she has a funny sensation around the left thigh and face.   Update 05/18/2016 ; she returns for follow-up after initial consultation 23months ago. She states she is doing better. She's not had any further episodes of lightheadedness and dizziness. She has not been doing regular stress laxation activities but does them intermittently and finds them helpful. She had 48-hour Holter monitoring which was negative for significant cardiac arrhythmias. MRI scan of the brain was normal except for slightly atrophied pituitary gland and slight dilatation up optic nerve sheaths the finding of unclear significance. Patient denies any vision problems or significant headaches. She was having headaches in the past but did improve after she had better control of her blood pressure and after taking medication daily. She's lost some weight but not significant amount. She does complain of increased sweating and has not yet discussed this with primary physician. She developed a rash in her forearms and was seen in urgent care and treated with prednisone taper pack which has improved. ROS:   14 system review of systems is positive for  hearing intolerance, excessive sweating, restless legs, itching. and all other systems negative  PMH:  Past Medical History:  Diagnosis Date  . Anemia   . Arthritis   . Calcaneal spur of foot   . Carpal tunnel syndrome on both  sides   . Essential hypertension   . Frozen shoulder    Left  . Gastroesophageal reflux disease   . Hypertension   . Insulin resistance   . Knee joint pain    Bilateral;  R>L  . Left sided sciatica   . Lumbar paraspinal muscle spasm   . Tendonitis of ankle, left   . Thalassemia trait     Social History:  Social History   Social History  . Marital status: Married    Spouse name: N/A  . Number of  children: N/A  . Years of education: N/A   Occupational History  . Medical Assistant     [employer]   Social History Main Topics  . Smoking status: Never Smoker  . Smokeless tobacco: Never Used  . Alcohol use No  . Drug use: No  . Sexual activity: Yes    Partners: Male    Birth control/ protection: Other-see comments     Comment: NO CYCLES; PT HAD HYST   Other Topics Concern  . Not on file   Social History Narrative  . No narrative on file    Medications:   Current Outpatient Prescriptions on File Prior to Visit  Medication Sig Dispense Refill  . albuterol (PROVENTIL HFA;VENTOLIN HFA) 108 (90 BASE) MCG/ACT inhaler Inhale 2 puffs into the lungs every 6 (six) hours as needed for wheezing. 1 Inhaler 2  . cholecalciferol (VITAMIN D) 1000 UNITS tablet Take 1,000 Units by mouth daily.    . Cyanocobalamin (VITAMIN B 12 PO) Take by mouth.    . famotidine (PEPCID) 40 MG tablet Take 0.5 tablets (20 mg total) by mouth daily as needed for heartburn. 30 tablet 5  . fish oil-omega-3 fatty acids 1000 MG capsule Take 2 g by mouth daily.    . fluticasone (FLONASE) 50 MCG/ACT nasal spray USE 1 SPRAY IN EACH NOSTRIL DAILY AS NEEDED  1  . hydrochlorothiazide (HYDRODIURIL) 12.5 MG tablet TAKE 1 TABLET BY MOUTH DAILY FOR 30 DAYS  12  . ibuprofen (ADVIL,MOTRIN) 800 MG tablet TAKE 1 TABLET EVERY 8 HOURS AS NEEDED FOR PAIN 90 tablet 3  . lisinopril (PRINIVIL,ZESTRIL) 10 MG tablet Take 1 tablet (10 mg total) by mouth 2 (two) times daily. 30 tablet 0  . meclizine (ANTIVERT) 25 MG tablet Take 1 tablet (25 mg total) by mouth 2 (two) times daily. (Patient taking differently: Take 25 mg by mouth 2 (two) times daily as needed. ) 30 tablet 0  . metFORMIN (GLUCOPHAGE) 500 MG tablet Take 500 mg by mouth 2 (two) times daily with a meal.    . ondansetron (ZOFRAN) 4 MG tablet TAKE 1 TAB BY MOUTH EVERY 8 HOURS AS NEEDED FOR NAUSEA  0  . traMADol (ULTRAM) 50 MG tablet Take 50 mg by mouth every 6 (six)  hours as needed.  0  . vitamin E 400 UNIT capsule Take by mouth.     No current facility-administered medications on file prior to visit.     Allergies:   Allergies  Allergen Reactions  . Bextra [Valdecoxib] Hives  . Morphine And Related Itching  . Other     Pt is allergic to many eye drops.   . Penicillins Hives and Swelling    No Ciillins per pt. Has patient had a PCN reaction causing immediate rash, facial/tongue/throat swelling, SOB or lightheadedness with hypotension: No Has patient had a PCN reaction causing severe rash involving mucus membranes or skin necrosis: No Has patient had a PCN reaction  that required hospitalization No Has patient had a PCN reaction occurring within the last 10 years: No If all of the above answers are "NO", then may proceed with Cephalosporin use.  Rolland Porter Flavor Itching  . Tobramycin Hives    Physical Exam General: Obese middle-aged African American lady, seated, in no evident distress Head: head normocephalic and atraumatic.   Neck: supple with no carotid or supraclavicular bruits Cardiovascular: regular rate and rhythm, no murmurs Musculoskeletal: no deformity Skin:  no rash/petichiae Vascular:  Normal pulses all extremities  Neurologic Exam Mental Status: Awake and fully alert. Oriented to place and time. Recent and remote memory intact. Attention span, concentration and fund of knowledge appropriate. Mood and affect appropriate.  Cranial Nerves: Fundoscopic exam reveals sharp disc margins. Pupils equal, briskly reactive to light. Extraocular movements full without nystagmus. Visual fields full to confrontation. Hearing intact. Facial sensation intact. Face, tongue, palate moves normally and symmetrically.  Motor: Normal bulk and tone. Normal strength in all tested extremity muscles. Sensory.: intact to touch , pinprick , position and vibratory sensation.  Coordination: Rapid alternating movements normal in all extremities.  Finger-to-nose and heel-to-shin performed accurately bilaterally. Head shaking did not produce any objective nystagmus but she complained of mild dizziness. Fukuda stepping test is positive with patient taking several steps but without rotation. Hallpike maneuver was not done. Gait and Station: Arises from chair without difficulty. Stance is normal. Gait demonstrates normal stride length and balance . Able to heel, toe and tandem walk without difficulty.  Reflexes: 1+ and symmetric. Toes downgoing.       ASSESSMENT: 14 year Caucasian lady with the intermittent lightheadedness episodes of unclear etiology. Neurological exam and ENT evaluation was fairly benign. She does have mild peripheral vestibular dysfunction  on exam but also admits to significant underlying anxiety and stress. MRI scan of the brain shows slight atrophy of the pituitary gland and dilatation of optic nerve sheaths finding of unclear significance as she does not have clinical history to suggest benign intracranial hypertension or papilledema on eye exam    PLAN: I had a long discussion with the patient regarding her symptoms of intermittent lightheadedness and dizziness which seem to have improved. 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 She does not have significant papilledema, headaches or vision complaints to indicate benign intracranial hypertension at the present time. I encouraged her again to increase participation in activities for stress laxation like exercise, swimming, medication and yoga. She will return for follow-up in 6 months with nurse practitioner or earlier if she has headaches or vision symptoms.  Delia Heady, MD  [facility] [address] [place], [place] [address]  Phone [phone] Fax [phone]   Note: This document was  prepared with digital dictation and possible smart phrase technology. Any transcriptional errors that result from this process are unintentional.

## 2016-05-19 LAB — FOLLICLE STIMULATING HORMONE: FSH: 22.7 m[IU]/mL

## 2016-05-19 LAB — T3, FREE: T3 FREE: 2.5 pg/mL (ref 2.0–4.4)

## 2016-05-19 LAB — TSH: TSH: 1.83 u[IU]/mL (ref 0.450–4.500)

## 2016-05-19 LAB — T4, FREE: Free T4: 1.1 ng/dL (ref 0.82–1.77)

## 2016-05-19 LAB — GROWTH HORMONE: Growth Hormone: <0.1 ng/mL (ref 0.0–10.0)

## 2016-05-19 LAB — PROLACTIN: PROLACTIN: 12.5 ng/mL (ref 4.8–23.3)

## 2016-05-19 LAB — LUTEINIZING HORMONE: LH: 14 m[IU]/mL

## 2016-05-26 ENCOUNTER — Telehealth: Payer: Self-pay | Admitting: *Deleted

## 2016-05-26 NOTE — Telephone Encounter (Signed)
Per Dr Leonie Man, LVM informing patient her thyroid and pituitary hormone levels were normal. Left name, number for questions.

## 2016-11-23 ENCOUNTER — Ambulatory Visit: Payer: Managed Care, Other (non HMO) | Admitting: Nurse Practitioner

## 2016-11-24 ENCOUNTER — Encounter: Payer: Self-pay | Admitting: Nurse Practitioner

## 2017-09-20 ENCOUNTER — Institutional Professional Consult (permissible substitution): Payer: Managed Care, Other (non HMO) | Admitting: Neurology

## 2017-11-09 ENCOUNTER — Telehealth: Payer: Self-pay | Admitting: Neurology

## 2017-11-09 NOTE — Telephone Encounter (Signed)
Sent to patient referrals.

## 2017-11-09 NOTE — Telephone Encounter (Signed)
Called and lm for patient to call [facility] to r/s this apt. Thanks

## 2017-11-15 ENCOUNTER — Institutional Professional Consult (permissible substitution): Payer: Managed Care, Other (non HMO) | Admitting: Neurology

## 2018-01-12 ENCOUNTER — Encounter: Payer: Self-pay | Admitting: Neurology

## 2018-01-12 ENCOUNTER — Ambulatory Visit (INDEPENDENT_AMBULATORY_CARE_PROVIDER_SITE_OTHER): Payer: Managed Care, Other (non HMO) | Admitting: Neurology

## 2018-01-12 VITALS — BP 121/72 | HR 78 | Wt 346.0 lb

## 2018-01-12 DIAGNOSIS — R200 Anesthesia of skin: Secondary | ICD-10-CM | POA: Diagnosis not present

## 2018-01-12 MED ORDER — TOPIRAMATE 50 MG PO TABS: 50.0000 mg | ORAL_TABLET | Freq: Two times a day (BID) | ORAL | 2 refills | Status: DC

## 2018-01-12 NOTE — Progress Notes (Signed)
[facility] [address] [place]. [place] [address] [phone]       OFFICE CONSULT NOTE  Allison. Allison Henderson Date of Birth:  Jul 15, 1961 Medical Record Number:  [id number]   Referring MD:  Rodolph Bong Reason for Referral:  Leg numbness HPI: Allison Henderson is a 57 year African-American lady who started developing numbness in her feet for a year. She is states that this has progressed particularly over the last 3 months in the left leg. She reports that she is numb in the lateral aspect of the left leg below the knee. She also has burning and tingling in her feet which is constant. This appears to be more pronounced at night in fact she is unable to sleep on her left side because the discomfort and pain bother her so much. She has not tried any specific medications for her burning in her feet. She does have prediabetes and does take Glucophage and states her last A1c was 6.0 in September. She also has bad arthritis in her right hip, knee as well as back and cold the last 3 months has needed to walk with a wheeled walker. 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 She denies any falls or injuries. She denies any weakness in the legs.She was seen by me 2 years ago for episodes of dizziness and at that time brain imaging as well as ENT evaluation were unremarkable. She states she no longer having dizzy spells and she cannot remember the last time that happened.review of electronic medical records show MRI scan of the brain with and without contrast on 02/24/16 which showed slight dilatation of the optic nerve sheaths the finding of unclear significance and no other abnormality.  ROS:   14 system review of systems is positive for  Numbness, leg pain, walking difficulty, back pain, hip pain, knee pain, aching muscles, sleepiness and all other systems negative  PMH:  Past Medical History:  Diagnosis Date  . Anemia   . Arthritis   .  Calcaneal spur of foot   . Carpal tunnel syndrome on both sides   . Essential hypertension   . Frozen shoulder    Left  . Gastroesophageal reflux disease   . Hypertension   . Insulin resistance   . Knee joint pain    Bilateral;  R>L  . Left sided sciatica   . Lumbar paraspinal muscle spasm   . Tendonitis of ankle, left   . Thalassemia trait     Social History:  Social History   Socioeconomic History  . Marital status: Married    Spouse name: Not on file  . Number of children: Not on file  . Years of education: Not on file  . Highest education level: Not on file  Occupational History  . Occupation: Engineer, site    Comment: [employer]  Social Needs  . Financial resource strain: Not on file  . Food insecurity:    Worry: Not on file    Inability: Not on file  . Transportation needs:    Medical: Not on file    Non-medical: Not on file  Tobacco Use  . Smoking status: Never Smoker  . Smokeless tobacco: Never Used  Substance and Sexual Activity  . Alcohol use: No  . Drug use: No  . Sexual activity: Yes    Partners: Male    Birth control/protection: Other-see comments    Comment: NO CYCLES; PT HAD HYST  Lifestyle  . Physical activity:    Days per week: Not on file    Minutes per session: Not on file  . Stress: Not on file  Relationships  . Social connections:    Talks on phone: Not on file    Gets together: Not on file    Attends religious service: Not on file    Active member of club or organization: Not on file    Attends meetings of clubs or organizations: Not on file    Relationship status: Not on file  . Intimate partner violence:    Fear of current or ex partner: Not on file    Emotionally abused: Not on file    Physically abused: Not on file    Forced sexual activity: Not on file  Other Topics Concern  . Not on file  Social History Narrative  . Not on file    Medications:   Current Outpatient Medications on File Prior to Visit    Medication Sig Dispense Refill  . albuterol (PROVENTIL HFA;VENTOLIN HFA) 108 (90 BASE) MCG/ACT inhaler Inhale 2 puffs into the lungs every 6 (six) hours as needed for wheezing. 1 Inhaler 2  . ALPRAZolam (XANAX) 0.25 MG tablet TAKE 1 TAB BY MOUTH ONCE DAILY AS NEEDED  0  . cholecalciferol (VITAMIN D) 1000 UNITS tablet Take 1,000 Units by mouth daily.    . Cyanocobalamin (VITAMIN B 12 PO) Take by mouth.    . famotidine (PEPCID) 40 MG tablet Take 0.5 tablets (20 mg total) by mouth daily as needed for heartburn. 30 tablet 5  . fish oil-omega-3 fatty acids 1000 MG capsule Take 2 g by mouth daily.    . fluticasone (FLONASE) 50 MCG/ACT nasal spray USE 1 SPRAY IN EACH NOSTRIL DAILY AS NEEDED  1  . hydrochlorothiazide (HYDRODIURIL) 12.5 MG tablet TAKE 1 TABLET BY MOUTH DAILY FOR 30 DAYS  12  . ibuprofen (ADVIL,MOTRIN) 800 MG tablet TAKE 1 TABLET EVERY 8 HOURS AS NEEDED FOR PAIN 90 tablet 3  . lisinopril (PRINIVIL,ZESTRIL) 10 MG tablet Take 1 tablet (10 mg total) by mouth 2 (two) times daily. 30 tablet 0  . Magnesium 400 MG TABS Take by mouth.    . metFORMIN (GLUCOPHAGE) 500 MG tablet Take 500 mg by mouth 2 (two) times daily with a meal.    . ondansetron (ZOFRAN) 4 MG tablet TAKE 1 TAB BY MOUTH EVERY 8 HOURS AS NEEDED FOR NAUSEA  0  . traMADol (ULTRAM) 50 MG tablet Take 50 mg by mouth every 6 (six) hours as needed.  0  . vitamin E 400 UNIT capsule Take by mouth.     No current facility-administered medications on file prior to visit.     Allergies:   Allergies  Allergen Reactions  . Bextra [Valdecoxib] Hives  . Morphine And Related Itching  . Other     Pt is allergic to many eye drops.   . Penicillins Hives and Swelling    No Ciillins per pt. Has patient had a PCN reaction causing immediate rash, facial/tongue/throat swelling, SOB or lightheadedness with hypotension: No Has patient had a PCN reaction causing severe rash involving mucus membranes or skin necrosis: No Has patient had a PCN  reaction that required hospitalization No Has patient had a PCN reaction occurring within the last 10 years: No If all of the above answers are "NO", then may proceed with Cephalosporin use.  Rolland Porter Flavor Itching  . Tobramycin Hives    Physical Exam General: obese  middle-aged African-American lady, seated, in no evident distress Head: head normocephalic and atraumatic.   Neck: supple with no carotid or supraclavicular bruits Cardiovascular: regular rate and rhythm, no murmurs Musculoskeletal: no deformity Skin:  no rash/petichiae Vascular:  Normal pulses all extremities  Neurologic Exam Mental Status: Awake and fully alert. Oriented to place and time. Recent and remote memory intact. Attention span, concentration and fund of knowledge appropriate. Mood and affect appropriate.  Cranial Nerves: Fundoscopic exam reveals sharp disc margins. Pupils equal, briskly reactive to light. Extraocular movements full without nystagmus. Visual fields full to confrontation. Hearing intact. Facial sensation intact. Face, tongue, palate moves normally and symmetrically.  Motor: Normal bulk and tone. Normal strength in all tested extremity muscles. Sensory.: hyperesthesia to touch , pinprick , in both lower extremities from mid calf down. intactposition sense but diminished vibratory sensation over toes bilaterally. Romberg sign is negative Tinel sign over the fibular head is negative.  Coordination: Rapid alternating movements normal in all extremities. Finger-to-nose and heel-to-shin performed accurately bilaterally. Gait and Station: Arises from chair without difficulty. Gait is slightly wide-based but favoring of the right knee due to pain.she uses a wheeled walker  Reflexes: 1+ and symmetric except both knee jerks and ankle jerks are absent. Toes downgoing.       ASSESSMENT:  57 year old African-American lady with one year history of bilateral lower extremity paresthesias with increased left  leg numbness likely due to underlying peripheral neuropathy and possible component of left sural nerve entrapment.   PLAN: I had a long discussion with the patient regarding her symptoms of lower extremity paresthesias and left leg numbness likely represents mild peripheral neuropathy with possible left sural nerve entrapment.I recommend we check EMG nerve conduction studies and trial of Topamax 50 mg at night for 1 week to be increased to twice daily if tolerated to help with paresthesias and pain. Check neuropathy panel labs. She was advised to use of walker at all times for safety purposes. Greater than 50% time during this 45 minute consultation visit was spent on counseling and coordination of care about numbness and neuropathy and answering questionsShe will return for follow-up in 2 months or call earlier if necessary Delia Heady, MD  [facility] [address] [place], [place] [address]  Phone [phone] Fax [phone] Note: This document was prepared with digital dictation and possible smart phrase technology. Any transcriptional errors that result from this process are unintentional.

## 2018-01-12 NOTE — Patient Instructions (Signed)
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 Check neuropathy panel labs. She was advised to use of walker at all times for safety purposes. She will return for follow-up in 2 months or call earlier if necessary   Peripheral Neuropathy Peripheral neuropathy is a type of nerve damage. It affects nerves that carry signals between the spinal cord and other parts of the body. These are called peripheral nerves. With peripheral neuropathy, one nerve or a group of nerves may be damaged. What are the causes? Many things can damage peripheral nerves. For some people with peripheral neuropathy, the cause is unknown. Some causes include:  Diabetes. This is the most common cause of peripheral neuropathy.  Injury to a nerve.  Pressure or stress on a nerve that lasts a long time.  Too little vitamin B. Alcoholism can lead to this.  Infections.  Autoimmune diseases, such as multiple sclerosis and systemic lupus erythematosus.  Inherited nerve diseases.  Some medicines, such as cancer drugs.  Toxic substances, such as lead and mercury.  Too little blood flowing to the legs.  Kidney disease.  Thyroid disease.  What are the signs or symptoms? Different people have different symptoms. The symptoms you have will depend on which of your nerves is damaged. Common symptoms include:  Loss of feeling (numbness) in the feet and hands.  Tingling in the feet and hands.  Pain that burns.  Very sensitive skin.  Weakness.  Not being able to move a part of the body (paralysis).  Muscle twitching.  Clumsiness or poor coordination.  Loss of balance.  Not being able  to control your bladder.  Feeling dizzy.  Sexual problems.  How is this diagnosed? Peripheral neuropathy is a symptom, not a disease. Finding the cause of peripheral neuropathy can be hard. To figure that out, your health care provider will take a medical history and do a physical exam. A neurological exam will also be done. This involves checking things affected by your brain, spinal cord, and nerves (nervous system). For example, your health care provider will check your reflexes, how you move, and what you can feel. Other types of tests may also be ordered, such as:  Blood tests.  A test of the fluid in your spinal cord.  Imaging tests, such as CT scans or an MRI.  Electromyography (EMG). This test checks the nerves that control muscles.  Nerve conduction velocity tests. These tests check how fast messages pass through your nerves.  Nerve biopsy. A small piece of nerve is removed. It is then checked under a microscope.  How is this treated?  Medicine is often used to treat peripheral neuropathy. Medicines may include: ? Pain-relieving medicines. Prescription or over-the-counter medicine may be suggested. ? Antiseizure medicine. This may be used for pain. ? Antidepressants. These also may help ease pain from neuropathy. ? Lidocaine. This is a numbing medicine. You might wear a patch or be given a shot. ? Mexiletine. This medicine is typically used to help control irregular heart rhythms.  Surgery. Surgery may be needed to relieve pressure on a nerve or to destroy a nerve that is causing pain.  Physical therapy to help movement.  Assistive devices  to help movement. Follow these instructions at home:  Only take over-the-counter or prescription medicines as directed by your health care provider. Follow the instructions carefully for any given medicines. Do not take any other medicines without first getting approval from your health care provider.  If you have diabetes, work  closely with your health care provider to keep your blood sugar under control.  If you have numbness in your feet: ? Check every day for signs of injury or infection. Watch for redness, warmth, and swelling. ? Wear padded socks and comfortable shoes. These help protect your feet.  Do not do things that put pressure on your damaged nerve.  Do not smoke. Smoking keeps blood from getting to damaged nerves.  Avoid or limit alcohol. Too much alcohol can cause a lack of B vitamins. These vitamins are needed for healthy nerves.  Develop a good support system. Coping with peripheral neuropathy can be stressful. Talk to a mental health specialist or join a support group if you are struggling.  Follow up with your health care provider as directed. Contact a health care provider if:  You have new signs or symptoms of peripheral neuropathy.  You are struggling emotionally from dealing with peripheral neuropathy.  You have a fever. Get help right away if:  You have an injury or infection that is not healing.  You feel very dizzy or begin vomiting.  You have chest pain.  You have trouble breathing. This information is not intended to replace advice given to you by your health care provider. Make sure you discuss any questions you have with your health care provider. Document Released: 09/25/2002 Document Revised: 03/12/2016 Document Reviewed: 06/12/2013 Elsevier Interactive Patient Education  2017 [employer].

## 2018-01-14 LAB — NEUROPATHY PANEL
A/G Ratio: 1.2 (ref 0.7–1.7)
ALBUMIN ELP: 3.9 g/dL (ref 2.9–4.4)
ALPHA 2: 0.7 g/dL (ref 0.4–1.0)
Alpha 1: 0.2 g/dL (ref 0.0–0.4)
Anti Nuclear Antibody(ANA): NEGATIVE
BETA: 1.3 g/dL (ref 0.7–1.3)
Gamma Globulin: 0.9 g/dL (ref 0.4–1.8)
Globulin, Total: 3.2 g/dL (ref 2.2–3.9)
Rhuematoid fact SerPl-aCnc: <10 IU/mL (ref 0.0–13.9)
Sed Rate: 36 mm/hr (ref 0–40)
TOTAL PROTEIN: 7.1 g/dL (ref 6.0–8.5)
TSH: 1.71 u[IU]/mL (ref 0.450–4.500)
VITAMIN B 12: 211 pg/mL — AB (ref 232–1245)
Vit D, 25-Hydroxy: 25.3 ng/mL — ABNORMAL LOW (ref 30.0–100.0)

## 2018-01-20 ENCOUNTER — Telehealth: Payer: Self-pay

## 2018-01-20 NOTE — Telephone Encounter (Signed)
-----   Message from Garvin Fila, MD sent at 01/19/2018  4:57 PM EDT ----- Allison Henderson inform the patient that neuropathy panel lab shows low vitamin B-12and vitamin D levels. Rest of the lab work was normal. Advised the patient to see her primary physician Dr. Kelton Pillar to start B12 and vitamin D replacement.

## 2018-01-20 NOTE — Telephone Encounter (Signed)
Notes recorded by Marval Regal, RN on 01/20/2018 at 3:04 PM EDT Rn receive call from patient about her lab work results.The neuropathy panel lab shows low vitamin B-12and vitamin D levels. Rest of the lab work was normal. Advised the patient to see her primary physician Dr. Kelton Pillar to start B12 and vitamin D replacement. Pt stated her GYN can prescribed the medication where she works at. She can be seen sooner at [facility] office sooner. Rn will fax lab work to [phone] Attention Earnstine Regal PAC. Labs fax and confirmed. ------

## 2018-01-20 NOTE — Telephone Encounter (Signed)
Left vm for patient to call back about lab results 

## 2018-01-24 ENCOUNTER — Ambulatory Visit (INDEPENDENT_AMBULATORY_CARE_PROVIDER_SITE_OTHER): Payer: Managed Care, Other (non HMO) | Admitting: Neurology

## 2018-01-24 ENCOUNTER — Encounter: Payer: Self-pay | Admitting: Neurology

## 2018-01-24 DIAGNOSIS — R202 Paresthesia of skin: Secondary | ICD-10-CM

## 2018-01-24 DIAGNOSIS — R200 Anesthesia of skin: Secondary | ICD-10-CM | POA: Diagnosis not present

## 2018-01-24 NOTE — Procedures (Signed)
     HISTORY:  Allison Henderson is a 57 year old patient with history of morbid obesity and mild diabetes who has a 3-year history of left leg numbness and discomfort.  The numbness and pain is on the lateral aspect of her left leg below the knee, worse at night when she rolls on that side.  The patient is being evaluated for a possible neuropathy or a lumbosacral radiculopathy.  NERVE CONDUCTION STUDIES:  Nerve conduction studies were performed on the left lower extremity.  The patient refused study on the right leg.  The distal motor latencies and motor amplitudes for the left peroneal and posterior tibial nerves were normal with normal nerve conduction velocities for these nerves.  The left sural and peroneal sensory latencies were within normal limits.  The F-wave latency for the left posterior tibial nerve was normal.  EMG STUDIES:  EMG study was performed on the left lower extremity:  The tibialis anterior muscle reveals 2 to 4K motor units with full recruitment. No fibrillations or positive waves were seen. The peroneus tertius muscle reveals 2 to 4K motor units with full recruitment. No fibrillations or positive waves were seen. The medial gastrocnemius muscle reveals 1 to 3K motor units with full recruitment. No fibrillations or positive waves were seen. The vastus lateralis muscle reveals 2 to 4K motor units with full recruitment. No fibrillations or positive waves were seen. The iliopsoas muscle reveals 2 to 4K motor units with full recruitment. No fibrillations or positive waves were seen. The biceps femoris muscle (long head) reveals 2 to 4K motor units with full recruitment. No fibrillations or positive waves were seen. The lumbosacral paraspinal muscles were tested at 3 levels, and revealed no abnormalities of insertional activity at all 3 levels tested. There was good relaxation.   IMPRESSION:  Nerve conduction studies done on the left lower extremity were unremarkable, no  evidence of a neuropathy is seen.  EMG evaluation of the left lower extremity was normal, no evidence of an overlying lumbosacral radiculopathy is seen.  Jill Alexanders MD 01/24/2018 9:10 AM  [facility] [address] [place], [place] [address]  Phone [phone] Fax [phone]

## 2018-01-24 NOTE — Progress Notes (Signed)
Please refer to EMG and nerve conduction study procedure note. 

## 2018-01-25 ENCOUNTER — Telehealth: Payer: Self-pay

## 2018-01-25 NOTE — Progress Notes (Signed)
Kern Reap P 8 minutes ago (1:58 PM)      Pt returned RNs call, aware her results were unremarkable. Pt also states she takes vit D & B oral tablets       Documentation

## 2018-01-25 NOTE — Telephone Encounter (Signed)
Pt returned RNs call, aware her results were unremarkable. Pt also states she takes vit D & B oral tablets

## 2018-01-25 NOTE — Progress Notes (Addendum)
Kindly inform the patient had EMG no conduction study was unremarkable with no evidence of neuropathy seen     MNC    Nerve / Sites Muscle Latency Ref. Amplitude Ref. Rel Amp Segments Distance Velocity Ref. Area    ms ms mV mV %  cm m/s m/s mVms  L Peroneal - EDB     Ankle EDB 5.3 ?6.5 4.0 ?2.0 100 Ankle - EDB 9   12.0     Fib head EDB 11.6  5.1  127 Fib head - Ankle 29 46 ?44 16.2     Pop fossa EDB 13.7  5.0  97.3 Pop fossa - Fib head 10 48 ?44 16.9     Acc Peron EDB 6.0  0.4  7.08 Pop fossa - Ankle    0.6         Acc Peron - Pop fossa      L Tibial - AH     Ankle AH 4.9 ?5.8 8.9 ?4.0 100 Ankle - AH 9   16.7     Pop fossa AH 14.3  6.0  67.4 Pop fossa - Ankle 38 41 ?41 13.7         SNC    Nerve / Sites Rec. Site Peak Lat Ref.  Amp Ref. Segments Distance    ms ms V V  cm  L Sural - Ankle (Calf)     Calf Ankle 4.3 ?4.4 10 ?6 Calf - Ankle 14  L Superficial peroneal - Ankle     Lat leg Ankle 3.6 ?4.4 9 ?6 Lat leg - Ankle 14           F  Wave    Nerve F Lat Ref.   ms ms  L Tibial - AH 53.5 ?56.0       EMG full

## 2018-01-25 NOTE — Telephone Encounter (Signed)
-----   Message from Garvin Fila, MD sent at 01/25/2018  8:28 AM EDT -----   ----- Message ----- From: Kathrynn Ducking, MD Sent: 01/24/2018   9:15 AM To: Garvin Fila, MD

## 2018-01-25 NOTE — Telephone Encounter (Signed)
Left vm on work number to call back with results.

## 2018-03-24 ENCOUNTER — Ambulatory Visit: Payer: Managed Care, Other (non HMO) | Admitting: Neurology

## 2018-03-24 IMAGING — CT CT HEAD W/O CM
2 series · 16 of 30 positions shown, 20 images · non-contrast
Comparison: None.

CLINICAL DATA: Frontal and bitemporal headache.  Lightheadedness.

EXAM:
CT HEAD WITHOUT CONTRAST
TECHNIQUE: Contiguous axial images were obtained from the base of the skull
through the vertex without intravenous contrast.

[Series 201: head w/o, idose (1) · axial · non-contrast · 0.49mm/px · z∈[+66,+196]mm · 13 of 32 slices shown, 17 images]
[im 3/32  brain]
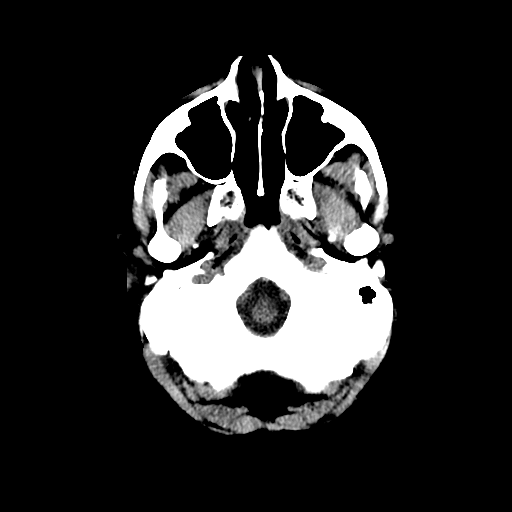
[im 3/32  bone]
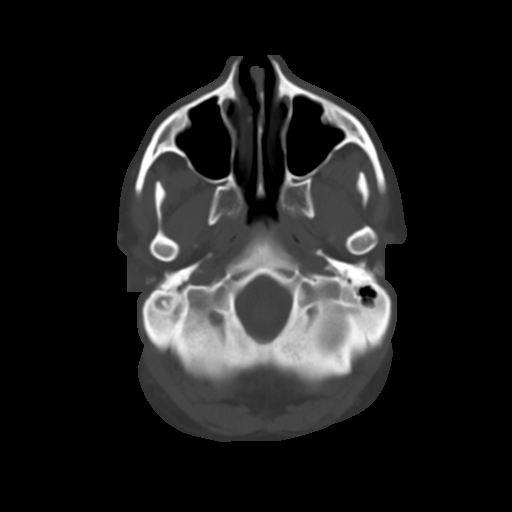
[im 5/32  brain]
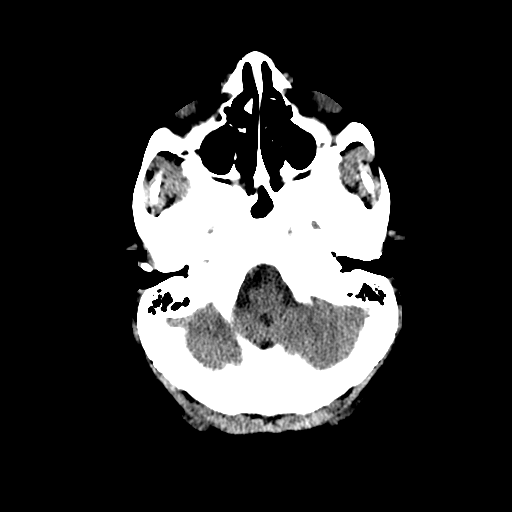
[im 7/32  brain]
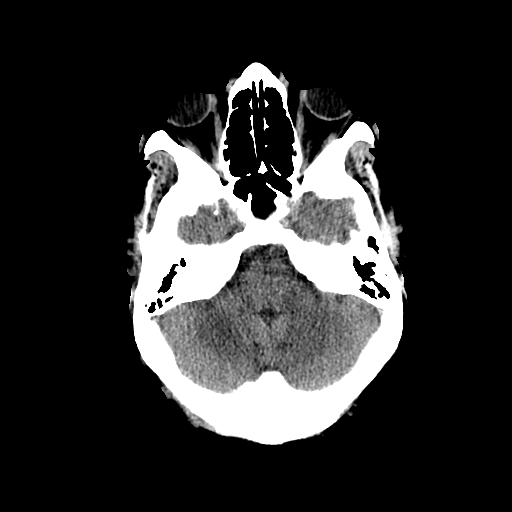
[im 9/32  brain]
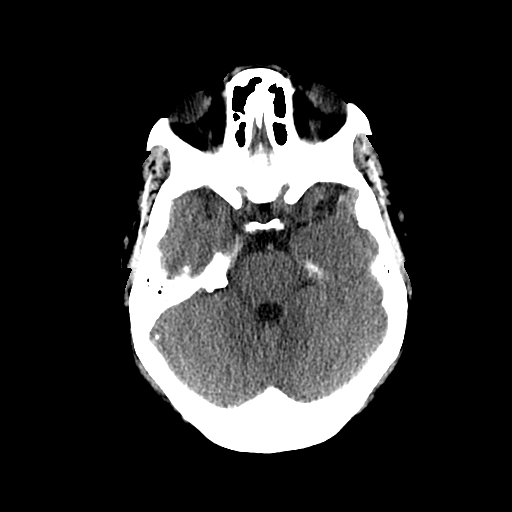
[im 12/32  brain]
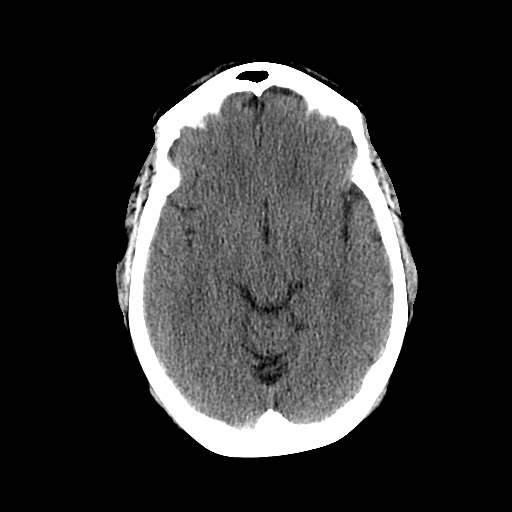
[im 12/32  bone]
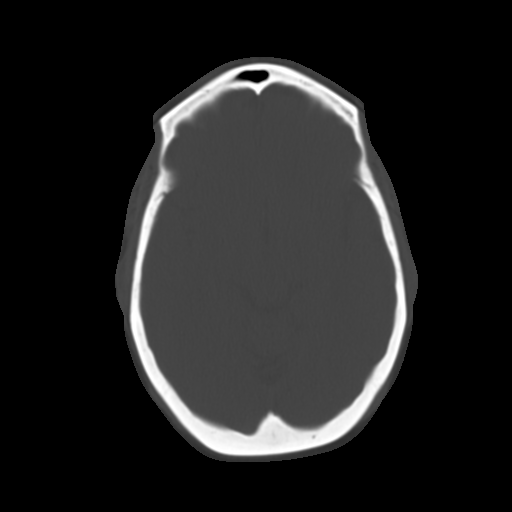
[im 14/32  brain]
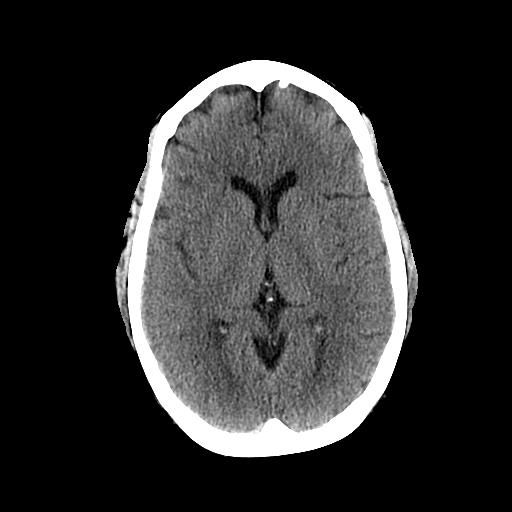
[im 16/32  brain]
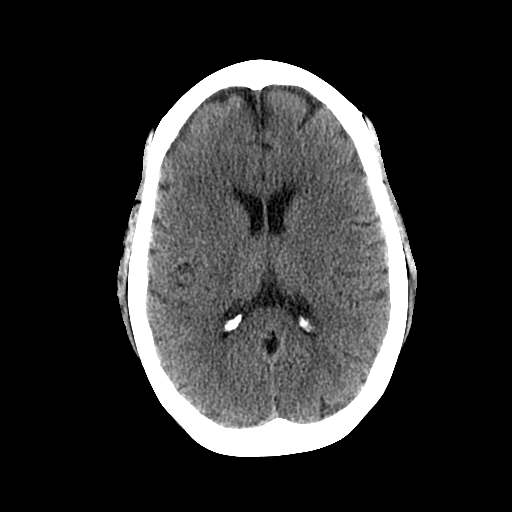
[im 18/32  brain]
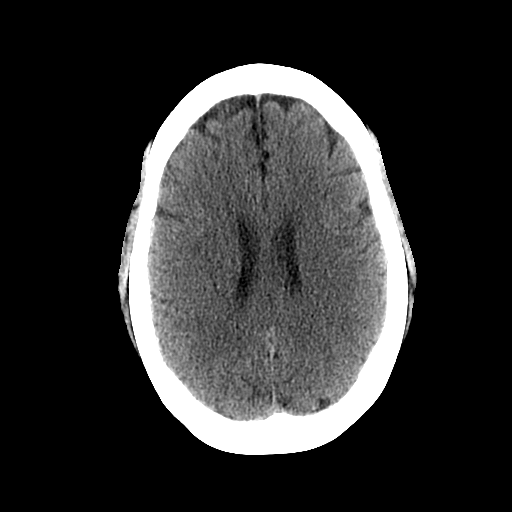
[im 20/32  brain]
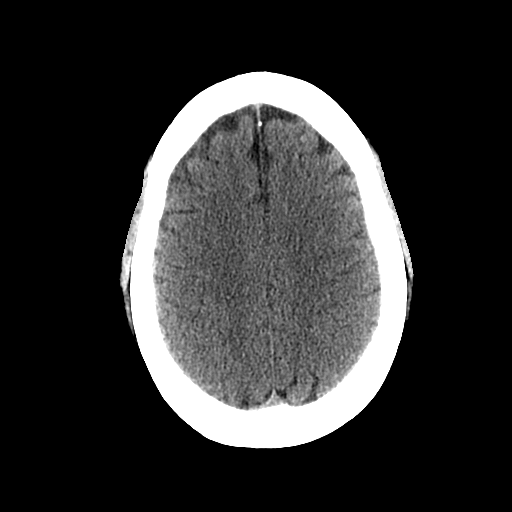
[im 20/32  bone]
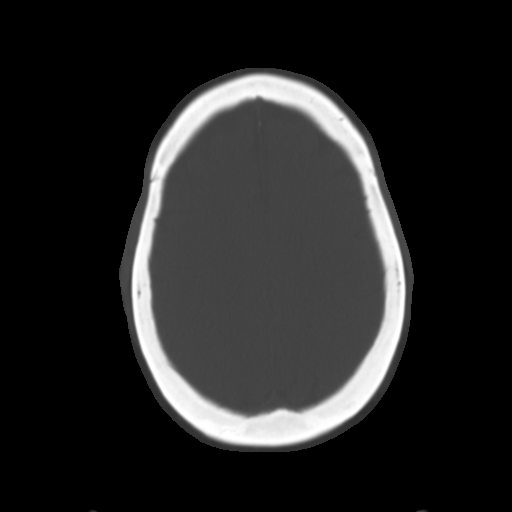
[im 23/32  brain]
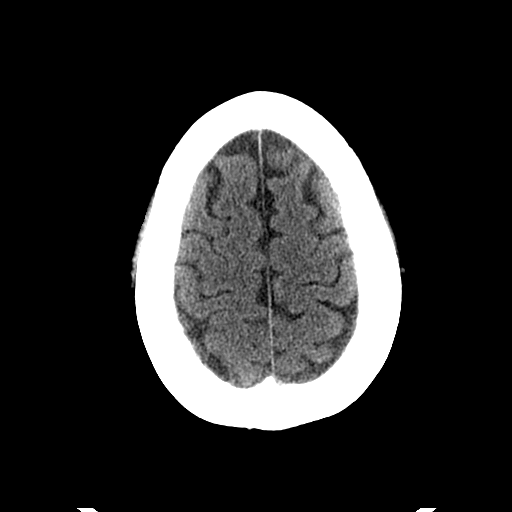
[im 25/32  brain]
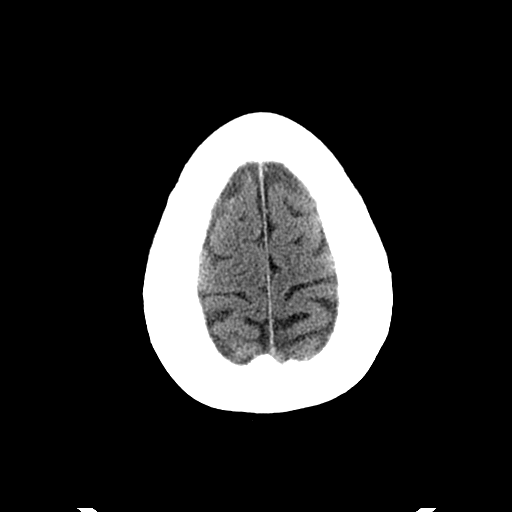
[im 27/32  brain]
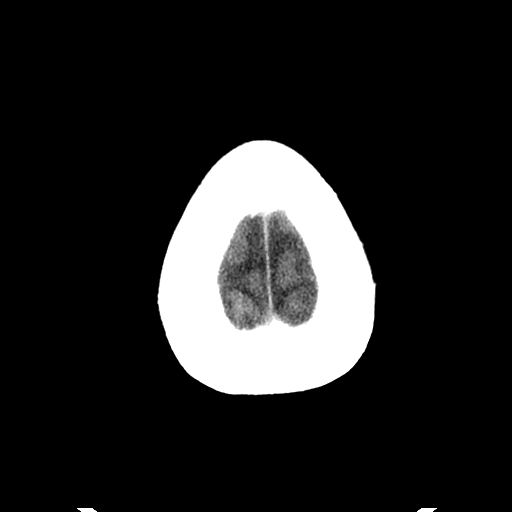
[im 29/32  brain]
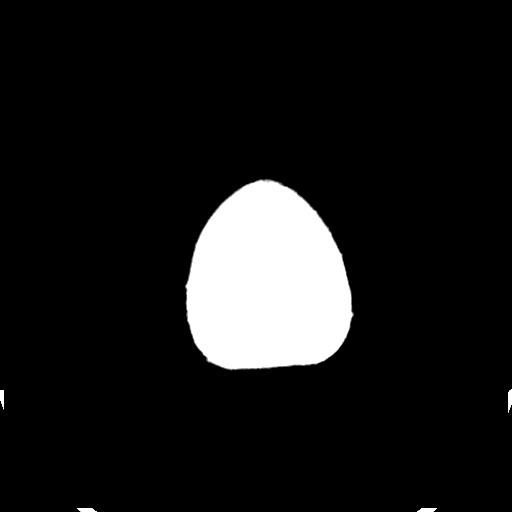
[im 29/32  bone]
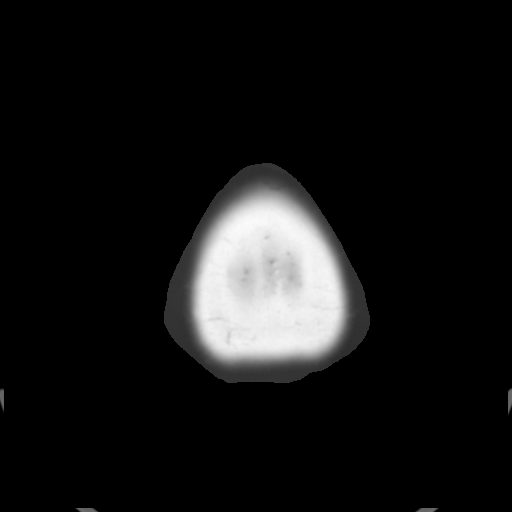

[Series 202: head w/o bone, idose (1) · axial · non-contrast · 0.49mm/px · z∈[+66,+111]mm · 3 of 32 slices shown]
[im 3/32  bone]
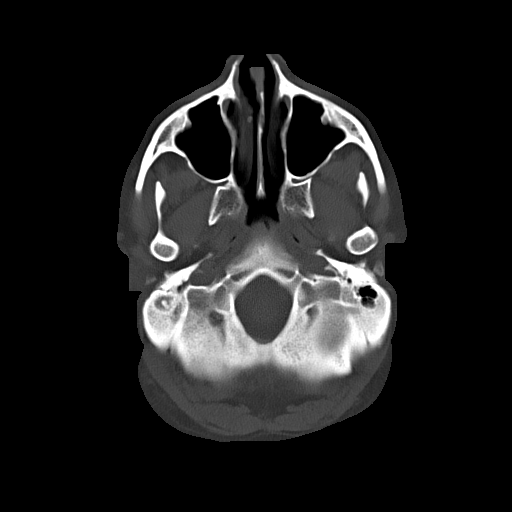
[im 7/32  bone]
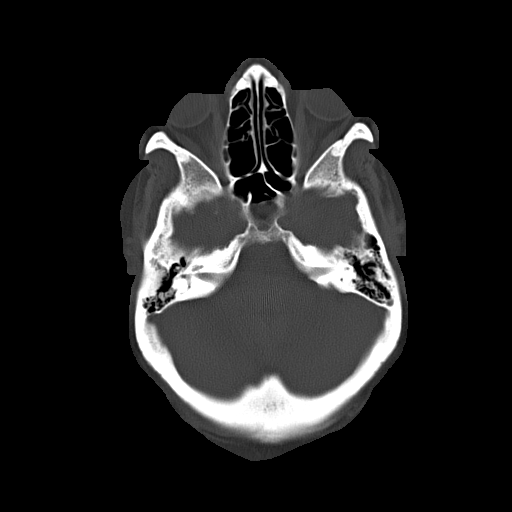
[im 12/32  bone]
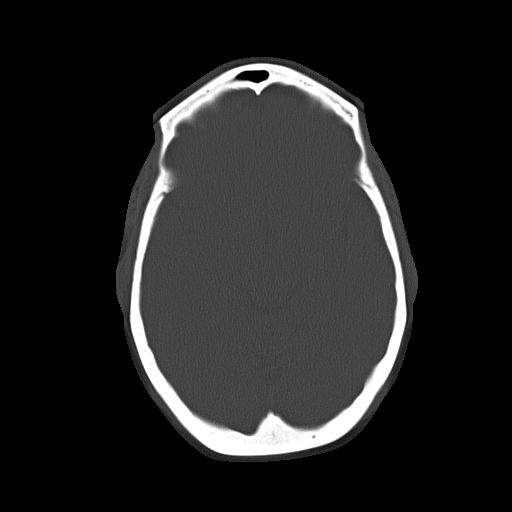

[16 of 30 positions shown; findings below may reference images not displayed]

FINDINGS: No acute intracranial abnormality. Specifically, no hemorrhage,
hydrocephalus, mass lesion, acute infarction, or significant
intracranial injury. No acute calvarial abnormality. Visualized
paranasal sinuses and mastoids clear. Orbital soft tissues
unremarkable.
IMPRESSION: Normal study.

## 2018-05-04 ENCOUNTER — Ambulatory Visit: Payer: Managed Care, Other (non HMO) | Admitting: Neurology

## 2018-05-04 ENCOUNTER — Telehealth: Payer: Self-pay | Admitting: *Deleted

## 2018-05-04 NOTE — Telephone Encounter (Signed)
Pt called same day of appt to cancel/sick

## 2018-05-05 ENCOUNTER — Encounter: Payer: Self-pay | Admitting: Neurology

## 2018-08-17 ENCOUNTER — Encounter (INDEPENDENT_AMBULATORY_CARE_PROVIDER_SITE_OTHER): Payer: Managed Care, Other (non HMO)

## 2018-08-30 ENCOUNTER — Ambulatory Visit (INDEPENDENT_AMBULATORY_CARE_PROVIDER_SITE_OTHER): Payer: Managed Care, Other (non HMO) | Admitting: Family Medicine

## 2018-09-14 ENCOUNTER — Ambulatory Visit (INDEPENDENT_AMBULATORY_CARE_PROVIDER_SITE_OTHER): Payer: Managed Care, Other (non HMO) | Admitting: Family Medicine

## 2021-10-15 ENCOUNTER — Encounter: Payer: BC Managed Care – PPO | Attending: Family Medicine | Admitting: Registered"

## 2021-10-15 ENCOUNTER — Other Ambulatory Visit: Payer: Self-pay

## 2021-10-15 ENCOUNTER — Encounter: Payer: Self-pay | Admitting: Registered"

## 2021-10-15 DIAGNOSIS — E669 Obesity, unspecified: Secondary | ICD-10-CM | POA: Insufficient documentation

## 2021-10-15 NOTE — Patient Instructions (Addendum)
-   Aim to increase physical activity with water aerobics at least 45 min, 1-2 times/week.   - Aim to increase fluid intake to at least 2 fillings of 52 oz water bottle.   - Aim to increase fiber intake with whole grains, fruits, and vegetables.   - Aim to have vegetables with lunch and dinner daily.   - Add vegetables to omelette.

## 2021-10-15 NOTE — Progress Notes (Signed)
°  Medical Nutrition Therapy  Appointment Start time:  9:20  Appointment End time:  10:28  Primary concerns today: a plan to get on the road of losing weight  Referral diagnosis: obesity Preferred learning style: no preference indicated Learning readiness:  ready, change in progress   NUTRITION ASSESSMENT   Pt arrives with husband. States she is insulin resistant and having a challenging time losing weight. States she used to weigh about 360 lbs and decreased to around 330 lbs. States she has been unable to lose more weight. States she needs double knee replacements and needs to decrease BMI to have surgery. States she does not want to have gastric bypass surgery. States she also wants to lose weight for herself. States she needs to improve constipation as well.   States she has made changes over time-reducing intake of sodas, drinking mainly water, and reducing fried food intake. States she is thinking about intermittent fasting to help with insulin resistance but is unsure.   States she and husband used to attend water aerobics at [employer] for 45 min, 3-4 times/week but stopped 3-4 months ago when pt's mom had a stroke. Pt states she shares care-taking responsibilities for mom with her sister. States she still has access to [employer] for water aerobics class.   States she currently works Mon-Fri 8-5 pm from home.   States she doesn't manage stress well.   Clinical Medical Hx: GERD, insulin resistance Medications: See list Labs: elevated A1c (5.9), elevated total Chol (206), elevated LDL (121) Notable Signs/Symptoms: normal pain that accompanies arthritis  Lifestyle & Dietary Hx  Estimated daily fluid intake: 52+ oz Supplements: See list Sleep: 4-6 hrs/night Stress / self-care: none reported Current average weekly physical activity: previously doing water aerobics 3-4x/week; none reported currently  24-Hr Dietary Recall First Meal: omelette (meat, cheese)+ warm lemon  water Snack:  Second Meal: [place] sandwich (with ham and cheese) Snack:  Third Meal: greens + red beans + mac and cheese + a little chicken + baked beans + potato salad + lemon pound cake Snack:  Beverages: water (52+ oz)   NUTRITION DIAGNOSIS  NB-1.1 Food and nutrition-related knowledge deficit As related to constipation.  As evidenced by pt verbalizes incomplete information.   NUTRITION INTERVENTION  Nutrition education (E-1) on the following topics: Pt was educated and counseled on constipation, metabolism, effects of dieting/restriction, role of fiber in eating regimen, and importance of physical activity. Discussed meal/snack planning and how to balance meals/snacks. Discussed complex carbohydrates versus simple carbohydrates. Pt agreed with goals listed.  Handouts Provided Include  Constipation Nutrition Therapy  Learning Style & Readiness for Change Teaching method utilized: Visual & Auditory  Demonstrated degree of understanding via: Teach Back  Barriers to learning/adherence to lifestyle change: contemplation stage of change  Goals Established by Pt Aim to increase physical activity with water aerobics at least 45 min, 1-2 times/week.  Aim to increase fluid intake to at least 2 fillings of 52 oz water bottle.  Aim to increase fiber intake with whole grains, fruits, and vegetables.  Aim to have vegetables with lunch and dinner daily.  Add vegetables to omelette.    MONITORING & EVALUATION Dietary intake, weekly physical activity.  Next Steps  Patient is to follow-up prn. Pt states they will try goals and call to schedule follow-up as needed.

## 2021-12-04 ENCOUNTER — Other Ambulatory Visit: Payer: Self-pay | Admitting: Family Medicine

## 2021-12-04 DIAGNOSIS — R1011 Right upper quadrant pain: Secondary | ICD-10-CM

## 2021-12-19 ENCOUNTER — Ambulatory Visit
Admission: RE | Admit: 2021-12-19 | Discharge: 2021-12-19 | Disposition: A | Discharge status: Home / Self Care | Payer: Managed Care, Other (non HMO) | Source: Ambulatory Visit | Attending: Family Medicine | Admitting: Family Medicine

## 2021-12-19 DIAGNOSIS — R1011 Right upper quadrant pain: Secondary | ICD-10-CM

## 2022-05-04 ENCOUNTER — Encounter: Payer: Managed Care, Other (non HMO) | Attending: Family Medicine | Admitting: Nutrition

## 2022-05-04 ENCOUNTER — Encounter: Payer: Self-pay | Admitting: Nutrition

## 2022-05-04 VITALS — Ht 64.0 in | Wt 347.6 lb

## 2022-05-04 DIAGNOSIS — Z713 Dietary counseling and surveillance: Secondary | ICD-10-CM | POA: Insufficient documentation

## 2022-05-04 DIAGNOSIS — E669 Obesity, unspecified: Secondary | ICD-10-CM

## 2022-05-04 DIAGNOSIS — E118 Type 2 diabetes mellitus with unspecified complications: Secondary | ICD-10-CM

## 2022-05-04 DIAGNOSIS — E119 Type 2 diabetes mellitus without complications: Secondary | ICD-10-CM | POA: Diagnosis not present

## 2022-05-04 NOTE — Patient Instructions (Addendum)
Goals  Folllow My Plate meal pattern using more plant based. Eat meals on time. Cut out sweets, chips. Don't eat after supper. Drink 80 oz of water Focus on foods from the garden. Avoid processed foods. Walk 15 minutes a day Keep a food journal

## 2022-05-04 NOTE — Progress Notes (Signed)
Medical Nutrition Therapy  Appointment Start time:  0800  Appointment End time:  0900  Primary concerns today: Type 2 DM , Obesity Referral diagnosis: E11.8, E66.9 Preferred learning style: NO preference Learning readiness: Ready    NUTRITION ASSESSMENT  Here with her husband.Has seen another dietitian in the past for constipation. Just got diagnosed with Type 2 DM a month ago. Had been prediabeitc for awhile, Had for been on steriods for her knees. Needs knee replacement. Mostly sedentary due to working from home. Does triage over the phone. Testing twice a day; am and bedtime  HIgheste weight was 363 lbs, lowest weight was 325 lbs a year ago. Had been down to 325 and was on [employer]. Avoided junk food and tracked what she ate.  Family history of DM Type 2. Has been having to help take care of her mom and that has been stressful. Admits she is a stressful eater.  Willing to work on lifestyle medicine for improvement in her health and reverse her DM. Her husband is supportive.  Complains of only getting 4-5 hours of sleep. Stays sleepy a lot. She notes she snores a lot at night.  Admits she does eat a lot of processed junk food; sweets, fast foods, processed meats. Her husband cooks. She likes to snack.  Has always been heavy. Admits to being a stress eater.  Willing to work with lifestyle medicine to lose weight and reverse/improve her chronic medical condition of DM, HTN, Hyperlipidia and sleep issues and arthritis.   Anthropometrics  Wt Readings from Last 3 Encounters:  05/04/22 (!) 347 lb 9.6 oz (157.7 kg)  01/12/18 (!) 346 lb (156.9 kg)  05/18/16 (!) 331 lb 3.2 oz (150.2 kg)   Ht Readings from Last 3 Encounters:  05/04/22 '5\' 4"'$  (1.626 m)  05/18/16 '5\' 4"'$  (1.626 m)  02/11/16 '5\' 4"'$  (1.626 m)   Body mass index is 59.67 kg/m. '@BMIFA'$ @ Facility age limit for growth %iles is 20 years. Facility age limit for growth %iles is 20 years.   A1C 7.4%.  Hyperlipidemia: TCHO 222  mg/dl., TG 262 mg/dl, LDL 112 mg/dl, NON HDL 157 mg/dl  MG 2.1 mg/dl wnl  TSH 1.63 WNL-don't see T 3 T4 levels.   Clinical Medical Hx: HTN, Hyperlipidia, Type 2 DM, Obesity, Arthiritis. Medications: Metformin 1000 mg BID Labs: Last 7.4%.  Notable Signs/Symptoms: Tired. Craves sweets and salty food.  Lifestyle & Dietary Hx LIves with her husband and they both cook and shop.  Estimated daily fluid intake: 60  oz Supplements: Vit E, Mag, Calcium, B12 Sleep: 4-5 Stress / self-care: life; care for her mom Current average weekly physical activity: ADL  24-Hr Dietary Recall First Meal: Eggs, [place] sausage, or oatmeal or cherrios, water Snack:  Second Meal: skipped yesterday; sandwich [place], water, fruit,  Snack: candy, chips Third Meal: Fish-fried, shrimp, coleslaw, water Snack: occassional fruit or popcorn Beverages: water  Estimated Energy Needs Calories: 1200 Carbohydrate: 135g Protein: 90g Fat: 33g   NUTRITION DIAGNOSIS  NI-1.7 Predicted excessive energy intake As related to Type 2 DM and morbid obesity.  As evidenced by A1C 7.4% and BMI 59.Marland Kitchen   NUTRITION INTERVENTION  Nutrition education (E-1) on the following topics:  Nutrition and Diabetes education provided on My Plate, CHO counting, meal planning, portion sizes, timing of meals, avoiding snacks between meals unless having a low blood sugar, target ranges for A1C and blood sugars, signs/symptoms and treatment of hyper/hypoglycemia, monitoring blood sugars, taking medications as prescribed, benefits of exercising 30 minutes per  day and prevention of complications of DM. Lifestyle Medicine  - Whole Food, Plant Predominant Nutrition is highly recommended: Eat Plenty of vegetables, Mushrooms, fruits, Legumes, Whole Grains, Nuts, seeds in lieu of processed meats, processed snacks/pastries red meat, poultry, eggs.    -It is better to avoid simple carbohydrates including: Cakes, Sweet Desserts, Ice Cream, Soda (diet and  regular), Sweet Tea, Candies, Chips, Cookies, Store Bought Juices, Alcohol in Excess of  1-2 drinks a day, Lemonade,  Artificial Sweeteners, Doughnuts, Coffee Creamers, "Sugar-free" Products, etc, etc.  This is not a complete list.....  Exercise: If you are able: 30 -60 minutes a day ,4 days a week, or 150 minutes a week.  The longer the better.  Combine stretch, strength, and aerobic activities.  If you were told in the past that you have high risk for cardiovascular diseases, you may seek evaluation by your heart doctor prior to initiating moderate to intense exercise programs.   Handouts Provided Include  LIfestyle Medicine booklet Meal Plan Card  Learning Style & Readiness for Change Teaching method utilized: Visual & Auditory  Demonstrated degree of understanding via: Teach Back  Barriers to learning/adherence to lifestyle change: none  Goals Established by Pt Goals  Folllow My Plate meal pattern using more plant based. Eat meals on time. Cut out sweets, chips. Don't eat after supper. Drink 80 oz of water Focus on foods from the garden. Avoid processed foods. Walk 15 minutes a day Test blood sugars twice a day; goal FBS less than 130 and Bedtime Less than 150's.   MONITORING & EVALUATION Dietary intake, weekly physical activity, and meal planning and weight in 1 month..  Recommend a referral for sleep apnea due to chronic risk factors.   Next Steps  Patient is to Work on meal planning and getting rid of processed junk food.Marland Kitchen

## 2022-06-08 ENCOUNTER — Ambulatory Visit: Payer: Managed Care, Other (non HMO) | Admitting: Nutrition

## 2022-06-08 ENCOUNTER — Other Ambulatory Visit: Payer: Self-pay | Admitting: Gastroenterology

## 2022-06-11 ENCOUNTER — Telehealth: Payer: Self-pay | Admitting: Physician Assistant

## 2022-06-11 NOTE — Telephone Encounter (Signed)
Scheduled appt per 8/24 referral. Pt is aware of appt date and time. Pt is aware to arrive 15 mins prior to appt time and to bring and updated insurance card. Pt is aware of appt location.   

## 2022-07-03 ENCOUNTER — Telehealth: Payer: Self-pay | Admitting: Physician Assistant

## 2022-07-03 ENCOUNTER — Other Ambulatory Visit: Payer: Self-pay | Admitting: Physician Assistant

## 2022-07-03 DIAGNOSIS — R109 Unspecified abdominal pain: Secondary | ICD-10-CM

## 2022-07-03 DIAGNOSIS — K5900 Constipation, unspecified: Secondary | ICD-10-CM

## 2022-07-03 NOTE — Telephone Encounter (Signed)
R/s pt's new hem appt time per 9/15 staff msg from [facility]. Called pt, no answer. Left msg with new appt time. Requested for pt to call back to confirm change.

## 2022-07-06 ENCOUNTER — Telehealth: Payer: Self-pay | Admitting: Hematology and Oncology

## 2022-07-06 NOTE — Telephone Encounter (Signed)
R/s pt's new hem appt per pt request. Pt is aware of new appt date/time.  

## 2022-07-08 ENCOUNTER — Other Ambulatory Visit: Payer: Managed Care, Other (non HMO)

## 2022-07-08 ENCOUNTER — Encounter: Payer: Managed Care, Other (non HMO) | Admitting: Physician Assistant

## 2022-07-08 ENCOUNTER — Ambulatory Visit
Admission: RE | Admit: 2022-07-08 | Discharge: 2022-07-08 | Disposition: A | Discharge status: Home / Self Care | Payer: Managed Care, Other (non HMO) | Source: Ambulatory Visit | Attending: Physician Assistant | Admitting: Physician Assistant

## 2022-07-08 DIAGNOSIS — R109 Unspecified abdominal pain: Secondary | ICD-10-CM

## 2022-07-08 DIAGNOSIS — K5900 Constipation, unspecified: Secondary | ICD-10-CM

## 2022-07-08 MED ORDER — IOPAMIDOL (ISOVUE-300) INJECTION 61%
100.0000 mL | Freq: Once | INTRAVENOUS | Status: AC | PRN
  Administered 2022-07-08: 100 mL via INTRAVENOUS

## 2022-07-20 ENCOUNTER — Encounter: Payer: Managed Care, Other (non HMO) | Attending: Family Medicine | Admitting: Nutrition

## 2022-07-20 ENCOUNTER — Encounter: Payer: Managed Care, Other (non HMO) | Admitting: Hematology and Oncology

## 2022-07-20 VITALS — Ht 65.0 in | Wt 342.0 lb

## 2022-07-20 DIAGNOSIS — E118 Type 2 diabetes mellitus with unspecified complications: Secondary | ICD-10-CM | POA: Insufficient documentation

## 2022-07-20 DIAGNOSIS — Z6841 Body Mass Index (BMI) 40.0 and over, adult: Secondary | ICD-10-CM | POA: Insufficient documentation

## 2022-07-20 DIAGNOSIS — E669 Obesity, unspecified: Secondary | ICD-10-CM

## 2022-07-20 NOTE — Patient Instructions (Signed)
Goals  Get sweets out of house and don't buy  it Cut down on breakfast meats 3 days a week Use prayer  or music to to help reduce stress Lose 1 lb per week,

## 2022-07-20 NOTE — Progress Notes (Unsigned)
Medical Nutrition Therapy  Appointment Start time:  23  Appointment End time:  74  Primary concerns today: Type 2 DM , Obesity Referral diagnosis: E11.8, E66.09 Preferred learning style: No preference Learning readiness: Ready    NUTRITION ASSESSMENT  Dm follow up Here with her husband.Has seen another dietitian in the past for constipation.  Recently.a1c was 5.9% down from 7.4%.. Felt that elevated BS was related to prednisone that was given for her knees. Is on Linzess. It has helped some since she started it. FBS 115-118 mg/dl,  Lost 5 lbs since last visit. Metformin 1000 mg in am and 500 mg at bedtime. Admits to sweets and candy being her weakness. Plans on getting them out of the house for less temptation.  Has been trying to eat more fruits and vegetables and cut down on portions. Working on not eating so  late or snacking after supper. Willing to work with lifestyle medicine to lose weight and reverse/improve her chronic medical condition of DM, HTN, Hyperlipidia and sleep issues and arthritis.   Anthropometrics  Wt Readings from Last 3 Encounters:  05/04/22 (!) 347 lb 9.6 oz (157.7 kg)  01/12/18 (!) 346 lb (156.9 kg)  05/18/16 (!) 331 lb 3.2 oz (150.2 kg)   Ht Readings from Last 3 Encounters:  05/04/22 '5\' 4"'$  (1.626 m)  05/18/16 '5\' 4"'$  (1.626 m)  02/11/16 '5\' 4"'$  (1.626 m)   There is no height or weight on file to calculate BMI. '@BMIFA'$ @ Facility age limit for growth %iles is 20 years. Facility age limit for growth %iles is 20 years.   A1C 7.4%.  Hyperlipidemia: TCHO 222 mg/dl., TG 262 mg/dl, LDL 112 mg/dl, NON HDL 157 mg/dl  MG 2.1 mg/dl wnl  TSH 1.63 WNL-don't see T 3 T4 levels.   Clinical Medical Hx: HTN, Hyperlipidia, Type 2 DM, Obesity, Arthiritis. Medications: Metformin 1000 mg BID Labs: Last 5.9% now, was 7.4% Notable Signs/Symptoms: Tired. Craves sweets and salty food.  Lifestyle & Dietary Hx LIves with her husband and they both cook and  shop.  Estimated daily fluid intake: 60  oz Supplements: Vit E, Mag, Calcium, B12 Sleep: 4-5 Stress / self-care: life; care for her mom Current average weekly physical activity: ADL  24-Hr Dietary Recall First Meal: eggs and spinach, with [place] bacon,  Second Meal: skipped yesterday; sandwich [place], water, fruit,  Snack: candy, chips Third Meal: Fish-fried, shrimp, coleslaw, water Snack: occassional fruit or popcorn Beverages: water  Estimated Energy Needs Calories: 1200 Carbohydrate: 135g Protein: 90g Fat: 33g   NUTRITION DIAGNOSIS  NI-1.7 Predicted excessive energy intake As related to Type 2 DM and morbid obesity.  As evidenced by A1C 7.4% and BMI 59.Marland Kitchen   NUTRITION INTERVENTION  Nutrition education (E-1) on the following topics:  Nutrition and Diabetes education provided on My Plate, CHO counting, meal planning, portion sizes, timing of meals, avoiding snacks between meals unless having a low blood sugar, target ranges for A1C and blood sugars, signs/symptoms and treatment of hyper/hypoglycemia, monitoring blood sugars, taking medications as prescribed, benefits of exercising 30 minutes per day and prevention of complications of DM. Lifestyle Medicine  - Whole Food, Plant Predominant Nutrition is highly recommended: Eat Plenty of vegetables, Mushrooms, fruits, Legumes, Whole Grains, Nuts, seeds in lieu of processed meats, processed snacks/pastries red meat, poultry, eggs.    -It is better to avoid simple carbohydrates including: Cakes, Sweet Desserts, Ice Cream, Soda (diet and regular), Sweet Tea, Candies, Chips, Cookies, Store Bought Juices, Alcohol in Excess of  1-2 drinks  a day, Lemonade,  Artificial Sweeteners, Doughnuts, Coffee Creamers, "Sugar-free" Products, etc, etc.  This is not a complete list.....  Exercise: If you are able: 30 -60 minutes a day ,4 days a week, or 150 minutes a week.  The longer the better.  Combine stretch, strength, and aerobic activities.  If you  were told in the past that you have high risk for cardiovascular diseases, you may seek evaluation by your heart doctor prior to initiating moderate to intense exercise programs.   Handouts Provided Include  LIfestyle Medicine booklet Meal Plan Card  Learning Style & Readiness for Change Teaching method utilized: Visual & Auditory  Demonstrated degree of understanding via: Teach Back  Barriers to learning/adherence to lifestyle change: none  Goals Established by Pt  Goals  Get sweets out of house and don't buy  it Cut down on breakfast meats 3 days a week Use prayer  or music to to help reduce stress Lose 1 lb per week,  MONITORING & EVALUATION Dietary intake, weekly physical activity, and meal planning and weight in 1 month..  Recommend a referral for sleep apnea due to chronic risk factors.   Next Steps  Patient is to Work on meal planning and getting rid of processed junk food.Marland Kitchen

## 2022-07-22 ENCOUNTER — Encounter: Payer: Self-pay | Admitting: Nutrition

## 2022-08-13 NOTE — Patient Instructions (Signed)
[facility]  Discharge Instructions  You were seen and examined today by Dr. Delton Coombes. Dr. Delton Coombes is a hematologist, meaning that he specializes in blood abnormalities. Dr. Delton Coombes discussed your past medical history, family history of cancers/blood conditions and the events that led to you being here today.   You were referred to Dr. Delton Coombes due to anemia, or low blood counts. Dr. Delton Coombes has recommended additional labs today for further evaluation.  Follow-up as scheduled.  Thank you for choosing [facility] to provide your oncology and hematology care.   To afford each patient quality time with our provider, please arrive at least 15 minutes before your scheduled appointment time. You may need to reschedule your appointment if you arrive late (10 or more minutes). Arriving late affects you and other patients whose appointments are after yours.  Also, if you miss three or more appointments without notifying the office, you may be dismissed from the clinic at the provider's discretion.    Again, thank you for choosing [facility].  Our hope is that these requests will decrease the amount of time that you wait before being seen by our physicians.   If you have a lab appointment with the [facility] please come in thru the Main Entrance and check in at the main information desk.           _____________________________________________________________  Should you have questions after your visit to [facility], please contact our office at [phone] and follow the prompts.  Our office hours are 8:00 a.m. to 4:30 p.m. Monday - Thursday and 8:00 a.m. to 2:30 p.m. Friday.  Please note that voicemails left after 4:00 p.m. may not be returned until the following business day.  We are closed weekends and all major holidays.  You do have access to a nurse 24-7, just call the main number to the clinic  [phone] and do not press any options, hold on the line and a nurse will answer the phone.    For prescription refill requests, have your pharmacy contact our office and allow 72 hours.    Masks are optional in the cancer centers. If you would like for your care team to wear a mask while they are taking care of you, please let them know. You may have one support person who is at least 60 years old accompany you for your appointments.

## 2022-08-14 ENCOUNTER — Inpatient Hospital Stay: Payer: Managed Care, Other (non HMO) | Attending: Hematology | Admitting: Hematology

## 2022-08-14 ENCOUNTER — Inpatient Hospital Stay: Payer: Managed Care, Other (non HMO)

## 2022-08-14 VITALS — BP 140/78 | HR 86 | Temp 98.5°F | Resp 18 | Ht 66.73 in | Wt 350.5 lb

## 2022-08-14 DIAGNOSIS — K760 Fatty (change of) liver, not elsewhere classified: Secondary | ICD-10-CM | POA: Insufficient documentation

## 2022-08-14 DIAGNOSIS — F5089 Other specified eating disorder: Secondary | ICD-10-CM

## 2022-08-14 DIAGNOSIS — Z807 Family history of other malignant neoplasms of lymphoid, hematopoietic and related tissues: Secondary | ICD-10-CM

## 2022-08-14 DIAGNOSIS — D509 Iron deficiency anemia, unspecified: Secondary | ICD-10-CM | POA: Insufficient documentation

## 2022-08-14 DIAGNOSIS — I10 Essential (primary) hypertension: Secondary | ICD-10-CM | POA: Diagnosis not present

## 2022-08-14 DIAGNOSIS — Z806 Family history of leukemia: Secondary | ICD-10-CM | POA: Diagnosis not present

## 2022-08-14 DIAGNOSIS — D508 Other iron deficiency anemias: Secondary | ICD-10-CM

## 2022-08-14 DIAGNOSIS — Z803 Family history of malignant neoplasm of breast: Secondary | ICD-10-CM | POA: Insufficient documentation

## 2022-08-14 DIAGNOSIS — K5909 Other constipation: Secondary | ICD-10-CM | POA: Insufficient documentation

## 2022-08-14 LAB — CBC WITH DIFFERENTIAL/PLATELET
Abs Immature Granulocytes: 0.04 10*3/uL (ref 0.00–0.07)
Basophils Absolute: 0 10*3/uL (ref 0.0–0.1)
Basophils Relative: 1 %
Eosinophils Absolute: 0.1 10*3/uL (ref 0.0–0.5)
Eosinophils Relative: 1 %
HCT: 32.7 % — ABNORMAL LOW (ref 36.0–46.0)
Hemoglobin: 9.8 g/dL — ABNORMAL LOW (ref 12.0–15.0)
Immature Granulocytes: 1 %
Lymphocytes Relative: 31 %
Lymphs Abs: 2.5 10*3/uL (ref 0.7–4.0)
MCH: 20.8 pg — ABNORMAL LOW (ref 26.0–34.0)
MCHC: 30 g/dL (ref 30.0–36.0)
MCV: 69.4 fL — ABNORMAL LOW (ref 80.0–100.0)
Monocytes Absolute: 0.7 10*3/uL (ref 0.1–1.0)
Monocytes Relative: 8 %
Neutro Abs: 4.9 10*3/uL (ref 1.7–7.7)
Neutrophils Relative %: 58 %
Platelets: 252 10*3/uL (ref 150–400)
RBC: 4.71 MIL/uL (ref 3.87–5.11)
RDW: 17.5 % — ABNORMAL HIGH (ref 11.5–15.5)
WBC: 8.3 10*3/uL (ref 4.0–10.5)
nRBC: 0 % (ref 0.0–0.2)

## 2022-08-14 LAB — COMPREHENSIVE METABOLIC PANEL
ALT: 18 U/L (ref 0–44)
AST: 17 U/L (ref 15–41)
Albumin: 3.3 g/dL — ABNORMAL LOW (ref 3.5–5.0)
Alkaline Phosphatase: 57 U/L (ref 38–126)
Anion gap: 7 (ref 5–15)
BUN: 13 mg/dL (ref 6–20)
CO2: 26 mmol/L (ref 22–32)
Calcium: 8.9 mg/dL (ref 8.9–10.3)
Chloride: 105 mmol/L (ref 98–111)
Creatinine, Ser: 0.84 mg/dL (ref 0.44–1.00)
GFR, Estimated: >60 mL/min (ref >60)
Glucose, Bld: 111 mg/dL — ABNORMAL HIGH (ref 70–99)
Potassium: 3.8 mmol/L (ref 3.5–5.1)
Sodium: 138 mmol/L (ref 135–145)
Total Bilirubin: 0.4 mg/dL (ref 0.3–1.2)
Total Protein: 6.9 g/dL (ref 6.5–8.1)

## 2022-08-14 LAB — VITAMIN B12: Vitamin B-12: 186 pg/mL (ref 180–914)

## 2022-08-14 LAB — IRON AND TIBC
Iron: 47 ug/dL (ref 28–170)
Saturation Ratios: 14 % (ref 10.4–31.8)
TIBC: 342 ug/dL (ref 250–450)
UIBC: 295 ug/dL

## 2022-08-14 LAB — RETICULOCYTES
Immature Retic Fract: 29.7 % — ABNORMAL HIGH (ref 2.3–15.9)
RBC.: 4.71 MIL/uL (ref 3.87–5.11)
Retic Count, Absolute: 84.3 10*3/uL (ref 19.0–186.0)
Retic Ct Pct: 1.8 % (ref 0.4–3.1)

## 2022-08-14 LAB — FERRITIN: Ferritin: 26 ng/mL (ref 11–307)

## 2022-08-14 LAB — FOLATE: Folate: 28.4 ng/mL (ref >5.9)

## 2022-08-14 LAB — LACTATE DEHYDROGENASE: LDH: 145 U/L (ref 98–192)

## 2022-08-14 NOTE — Progress Notes (Signed)
CONSULT NOTE  Patient Care Team: Kelton Pillar, MD as PCP - General (Family Medicine) Derek Jack, MD as Medical Oncologist (Hematology)  CHIEF COMPLAINTS/PURPOSE OF CONSULTATION:  Microcytic anemia  HISTORY OF PRESENTING ILLNESS:  Allison Henderson 61 y.o. female is seen for further work-up and management of microcytic anemia at the request of Deliah Goody, [place].  CBC on 06/10/2022 with Hb-9.9, MCV-66.  WBC and PLT were normal.  Absolute lymphocyte count was elevated.  CBC on 04/13/2022 with Hb-10.4, MCV-67.  Ferritin was 25 and percent saturation of 13.  Last colonoscopy was in December 2012, normal.  She has chronic constipation.  She is being scheduled for colonoscopy and EGD on 08/25/2022.  She also had a CT scan of the abdomen and pelvis on 07/08/2022 significant for fatty liver.  No prior history of blood transfusions or intravenous iron therapy.  She has ice pica.  She reportedly had bone marrow biopsy several years ago in [place] which was normal.  She has been taking Vitron-C 1 tablet daily for the past 1 month.  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  Denies any bleeding per rectum or melena.  Reports energy levels are 50% along with shortness of breath on exertion.  MEDICAL HISTORY:  Past Medical History:  Diagnosis Date   Anemia    Arthritis    Calcaneal spur of foot    Carpal tunnel syndrome on both sides    Essential hypertension    Frozen shoulder    Left   Gastroesophageal reflux disease    Hypertension    Insulin resistance    Knee joint pain    Bilateral;  R>L   Left sided sciatica    Lumbar paraspinal muscle spasm    Tendonitis of ankle, left    Thalassemia trait     SURGICAL HISTORY: Past Surgical History:  Procedure Laterality Date   TOTAL ABDOMINAL HYSTERECTOMY      SOCIAL HISTORY: Social History   Socioeconomic History   Marital status: Married    Spouse name: Not on file   Number of children: Not on file   Years of  education: Not on file   Highest education level: Not on file  Occupational History   Occupation: Psychologist, sport and exercise    Comment: [employer]  Tobacco Use   Smoking status: Never   Smokeless tobacco: Never  Substance and Sexual Activity   Alcohol use: No   Drug use: No   Sexual activity: Yes    Partners: Male    Birth control/protection: Other-see comments    Comment: NO CYCLES; PT HAD HYST  Other Topics Concern   Not on file  Social History Narrative   Not on file   Social Determinants of Health   Financial Resource Strain: Not on file  Food Insecurity: No Food Insecurity (10/15/2021)   Hunger Vital Sign    Worried About Running Out of Food in the Last Year: Never true    Ran Out of Food in the Last Year: Never true  Transportation Needs: Not on file  Physical Activity: Not on file  Stress: Not on file  Social Connections: Not on file  Intimate Partner Violence: Not on file    FAMILY HISTORY: Family History  Problem Relation Age of Onset   Hypertension Mother    Osteoarthritis Mother    Anemia Mother    Hypertension Father    Diabetes Father    Breast cancer Sister 45   Anemia  Sister    Hypertension Brother    Diabetes Brother    Lymphoma Maternal Grandmother    Emphysema Maternal Grandfather    Heart attack Paternal Grandfather    Colon cancer Other    Hyperlipidemia Other    Stroke Other     ALLERGIES:  is allergic to peanut-containing drug products, bextra [valdecoxib], morphine and related, other, penicillins, strawberry flavor, and tobramycin.  MEDICATIONS:  Current Outpatient Medications  Medication Sig Dispense Refill   albuterol (PROVENTIL HFA;VENTOLIN HFA) 108 (90 BASE) MCG/ACT inhaler Inhale 2 puffs into the lungs every 6 (six) hours as needed for wheezing. 1 Inhaler 2   ALPRAZolam (XANAX) 0.25 MG tablet   0   atorvastatin (LIPITOR) 10 MG tablet Take 10 mg by mouth daily.     cholecalciferol (VITAMIN D) 1000 UNITS tablet Take 1,000  Units by mouth daily.     Cyanocobalamin (VITAMIN B 12 PO) Take by mouth.     famotidine (PEPCID) 40 MG tablet Take 0.5 tablets (20 mg total) by mouth daily as needed for heartburn. 30 tablet 5   fluticasone (FLONASE) 50 MCG/ACT nasal spray USE 1 SPRAY IN EACH NOSTRIL DAILY AS NEEDED  1   hydrochlorothiazide (HYDRODIURIL) 12.5 MG tablet TAKE 1 TABLET BY MOUTH DAILY FOR 30 DAYS  12   ibuprofen (ADVIL,MOTRIN) 800 MG tablet TAKE 1 TABLET EVERY 8 HOURS AS NEEDED FOR PAIN 90 tablet 3   Iron-Vitamin C (VITRON-C) 65-125 MG TABS Take 1 tablet by mouth every other day.     LINZESS 290 MCG CAPS capsule Take 290 mcg by mouth every morning.     lisinopril (PRINIVIL,ZESTRIL) 10 MG tablet Take 1 tablet (10 mg total) by mouth 2 (two) times daily. 30 tablet 0   Magnesium 400 MG TABS Take by mouth.     metFORMIN (GLUCOPHAGE) 500 MG tablet Take 500 mg by mouth 2 (two) times daily with a meal.     ondansetron (ZOFRAN) 4 MG tablet TAKE 1 TAB BY MOUTH EVERY 8 HOURS AS NEEDED FOR NAUSEA  0   pantoprazole (PROTONIX) 40 MG tablet Take 40 mg by mouth daily.     traMADol (ULTRAM) 50 MG tablet Take 50 mg by mouth every 6 (six) hours as needed.  0   vitamin E 400 UNIT capsule Take by mouth.     topiramate (TOPAMAX) 50 MG tablet Take 1 tablet (50 mg total) by mouth 2 (two) times daily. Start 1 tablet at night x 1 week then twice daily 60 tablet 2   No current facility-administered medications for this visit.    REVIEW OF SYSTEMS:   Constitutional: Denies fevers, chills or abnormal night sweats Eyes: Denies blurriness of vision, double vision or watery eyes Ears, nose, mouth, throat, and face: Denies mucositis or sore throat Respiratory: Positive for dyspnea on exertion.  No cough or wheezing. Cardiovascular: Denies palpitation, chest discomfort or lower extremity swelling Gastrointestinal:  Denies nausea, heartburn or change in bowel habits Skin: Denies abnormal skin rashes Lymphatics: Denies new lymphadenopathy or  easy bruising Neurological:Denies numbness, tingling or new weaknesses Behavioral/Psych: Mood is stable, no new changes  All other systems were reviewed with the patient and are negative.  PHYSICAL EXAMINATION: ECOG PERFORMANCE STATUS: 0 - Asymptomatic  Vitals:   08/14/22 1019  BP: (!) 140/78  Pulse: 86  Resp: 18  Temp: 98.5 F (36.9 C)  SpO2: 97%   Filed Weights   08/14/22 1019  Weight: (!) 350 lb 8 oz (159 kg)    GENERAL:alert,  no distress and comfortable SKIN: skin color, texture, turgor are normal, no rashes or significant lesions EYES: normal, conjunctiva are pink and non-injected, sclera clear OROPHARYNX:no exudate, no erythema and lips, buccal mucosa, and tongue normal  NECK: supple, thyroid normal size, non-tender, without nodularity LYMPH:  no palpable lymphadenopathy in the cervical, axillary or inguinal LUNGS: clear to auscultation and percussion with normal breathing effort HEART: regular rate & rhythm and no murmurs and no lower extremity edema ABDOMEN:abdomen soft, non-tender and normal bowel sounds Musculoskeletal:no cyanosis of digits and no clubbing  PSYCH: alert & oriented x 3 with fluent speech NEURO: no focal motor/sensory deficits  LABORATORY DATA:  I have reviewed the data as listed No results found for this or any previous visit (from the past 2160 hour(s)).  RADIOGRAPHIC STUDIES: I have personally reviewed the radiological images as listed and agreed with the findings in the report. No results found.  ASSESSMENT:  1.  Microcytic anemia: - Patient seen at the request of Deliah Goody, [place] at [facility] gastroenterology. - CBC (06/10/2022): Hb-9.9, MCV-66, ALC-3000 ([id number]), ferritin-31, percent saturation-17 - CBC (04/13/2022): Hb-10.4, MCV-67, ferritin-25, percent saturation-13 - CBC (01/29/2021) Hb-10.3, ALC increased at 3.6. - Last colonoscopy in December 2012, normal. - CTAP (07/08/2022): Fatty liver.  Spleen size normal. - No prior history of  transfusion/parenteral iron.  Ice pica present. - BMBX in [place] normal several years ago. - Patient on Vitron-C daily for the last 1 month.  2.  Social/family history: - Seen with her husband Herbie Baltimore today.  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  She is a Quarry manager.  Non-smoker. - Niece has sickle cell trait.  Father had chronic leukemia.  Sister had breast cancer at age 38.  Maternal grandmother had lymphoma.  PLAN:  1.  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 - Will check CBC today and rule out nutritional deficiencies.  We will also check for bone marrow infiltrative process with SPEP, free light chains. - We will check hemoglobin electrophoresis. - If the iron is severely low, consider parenteral iron therapy as she has been taking Vitron-C for the past 1 month. - We will arrange for phone follow-up visit in 2 weeks.  2.  Increased absolute lymphocyte count: - She had elevated absolute lymphocyte count on more than 2 occasions with a normal white count.  If the lymphocyte clonality is confirmed, this is consistent with a monoclonal B-cell lymphocytosis. - Recommend flow cytometry.    All questions were answered. The patient knows to call the clinic with any problems, questions or concerns.      Derek Jack, MD 08/14/22 10:29 AM

## 2022-08-17 LAB — METHYLMALONIC ACID, SERUM: Methylmalonic Acid, Quantitative: 112 nmol/L (ref 0–378)

## 2022-08-17 LAB — KAPPA/LAMBDA LIGHT CHAINS
Kappa free light chain: 36.7 mg/L — ABNORMAL HIGH (ref 3.3–19.4)
Kappa, lambda light chain ratio: 1.98 — ABNORMAL HIGH (ref 0.26–1.65)
Lambda free light chains: 18.5 mg/L (ref 5.7–26.3)

## 2022-08-18 LAB — SURGICAL PATHOLOGY

## 2022-08-18 LAB — PROTEIN ELECTROPHORESIS, SERUM
A/G Ratio: 1.1 (ref 0.7–1.7)
Albumin ELP: 3.4 g/dL (ref 2.9–4.4)
Alpha-1-Globulin: 0.2 g/dL (ref 0.0–0.4)
Alpha-2-Globulin: 0.6 g/dL (ref 0.4–1.0)
Beta Globulin: 1.1 g/dL (ref 0.7–1.3)
Gamma Globulin: 1.1 g/dL (ref 0.4–1.8)
Globulin, Total: 3 g/dL (ref 2.2–3.9)
Total Protein ELP: 6.4 g/dL (ref 6.0–8.5)

## 2022-08-18 LAB — HGB FRACTIONATION CASCADE
Hgb A2: 2.5 % (ref 1.8–3.2)
Hgb A: 97.5 % (ref 96.4–98.8)
Hgb F: 0 % (ref 0.0–2.0)
Hgb S: 0 %

## 2022-08-25 ENCOUNTER — Ambulatory Visit (HOSPITAL_COMMUNITY)
Admission: RE | Admit: 2022-08-25 | Payer: Managed Care, Other (non HMO) | Source: Home / Self Care | Admitting: Gastroenterology

## 2022-08-25 ENCOUNTER — Encounter (HOSPITAL_COMMUNITY): Admission: RE | Payer: Self-pay | Source: Home / Self Care

## 2022-08-25 LAB — COPPER, SERUM: Copper: 122 ug/dL (ref 80–158)

## 2022-08-25 SURGERY — ESOPHAGOGASTRODUODENOSCOPY (EGD) WITH PROPOFOL
Anesthesia: Monitor Anesthesia Care

## 2022-08-31 ENCOUNTER — Inpatient Hospital Stay: Payer: Managed Care, Other (non HMO) | Attending: Hematology | Admitting: Hematology

## 2022-08-31 DIAGNOSIS — D508 Other iron deficiency anemias: Secondary | ICD-10-CM

## 2022-08-31 DIAGNOSIS — Z90710 Acquired absence of both cervix and uterus: Secondary | ICD-10-CM | POA: Insufficient documentation

## 2022-08-31 DIAGNOSIS — D72820 Lymphocytosis (symptomatic): Secondary | ICD-10-CM | POA: Insufficient documentation

## 2022-08-31 DIAGNOSIS — D509 Iron deficiency anemia, unspecified: Secondary | ICD-10-CM | POA: Insufficient documentation

## 2022-08-31 NOTE — Progress Notes (Signed)
Virtual Visit via Telephone Note  I connected with Allison Henderson on 08/31/22 at  3:45 PM EST by telephone and verified that I am speaking with the correct person using two identifiers.  Location: Patient: At home Provider: In the office   I discussed the limitations, risks, security and privacy concerns of performing an evaluation and management service by telephone and the availability of in person appointments. I also discussed with the patient that there may be a patient responsible charge related to this service. The patient expressed understanding and agreed to proceed.   History of Present Illness: She was seen by me on 08/14/2022 for evaluation of microcytic anemia at the request of Deliah Goody, [place].  CBC on 06/10/2022 showed hemoglobin 9.9 with MCV 66, ferritin of 31 and percent saturation of 17.  She was started on Vitron-C around 07/19/2022.  She had a hysterectomy in 2004.  Denied any bleeding per rectum or melena.   Observations/Objective: She is feeling tired.  Denies any bleeding per rectum or melena.  She has some constipation from Vitron-C.  Energy levels are reported as 40%.  Assessment and Plan:  1.  Microcytic anemia: - Reviewed labs from 08/14/2022.  Hemoglobin-9.8, MCV-69.  Ferritin-26 and percent saturation 14. - V61, folic acid, copper levels normal.  SPEP was negative.  Free light chain ratio is mildly elevated at 1.98 with kappa light chains 36.7 and lambda light chains normal at 18.5. - Hemoglobin electrophoresis shows HB A 97.5, Hb A2-2.5% (normal hemoglobin pattern, alpha thalassemia may not be detected). - It is possible that she is not absorbing iron as she has been on Protonix for several years. - We have talked about parenteral iron therapy.  We discussed side effects including rare chance of anaphylactic reactions. - She has allergic reactions to penicillin. - We will reach out to her insurance.  Depending on if they approve Venofer or Feraheme we will  schedule for 2 infusions.  If it is Venofer, we will schedule for two 400 mg infusions.  We will premedicate with Solu-Medrol 100 mg, Pepcid 20 mg, Benadryl 50 mg IV. - RTC 4 to 6 months for follow-up with repeat CBC, ferritin and iron panel.  2.  Elevated kappa light chains: - SPEP is negative.  Most likely reactive from elevated immunoglobulins. - We will monitor this down the line.  We will also consider immunofixation down the line.  3.  Lymphocytosis: - We have done a flow cytometry as she had lymphocytosis in the previous labs done outside [facility] system. - Repeat CBC on 08/14/2022 showed normal absolute lymphocyte count. - Flow cytometry did not show any aberrant B or T-cell population.  No further work-up needed.   Follow Up Instructions:    I discussed the assessment and treatment plan with the patient. The patient was provided an opportunity to ask questions and all were answered. The patient agreed with the plan and demonstrated an understanding of the instructions.   The patient was advised to call back or seek an in-person evaluation if the symptoms worsen or if the condition fails to improve as anticipated.  I provided 28 minutes of non-face-to-face time during this encounter.   Derek Jack, MD

## 2022-09-01 ENCOUNTER — Encounter: Payer: Self-pay | Admitting: Hematology

## 2022-09-01 DIAGNOSIS — D509 Iron deficiency anemia, unspecified: Secondary | ICD-10-CM | POA: Insufficient documentation

## 2022-09-01 LAB — FLOW CYTOMETRY

## 2022-09-01 NOTE — Addendum Note (Signed)
Addended by: Derek Jack on: 09/01/2022 08:08 AM   Modules accepted: Orders

## 2022-09-07 ENCOUNTER — Inpatient Hospital Stay: Payer: Managed Care, Other (non HMO)

## 2022-09-07 VITALS — BP 145/72 | HR 86 | Temp 98.7°F | Resp 18 | Wt 344.6 lb

## 2022-09-07 DIAGNOSIS — D508 Other iron deficiency anemias: Secondary | ICD-10-CM

## 2022-09-07 DIAGNOSIS — Z90710 Acquired absence of both cervix and uterus: Secondary | ICD-10-CM | POA: Diagnosis not present

## 2022-09-07 DIAGNOSIS — D509 Iron deficiency anemia, unspecified: Secondary | ICD-10-CM | POA: Diagnosis present

## 2022-09-07 DIAGNOSIS — D72820 Lymphocytosis (symptomatic): Secondary | ICD-10-CM | POA: Diagnosis not present

## 2022-09-07 MED ORDER — DIPHENHYDRAMINE HCL 50 MG/ML IJ SOLN
50.0000 mg | Freq: Once | INTRAMUSCULAR | Status: AC
  Administered 2022-09-07: 50 mg via INTRAVENOUS
  Filled 2022-09-07: qty 1

## 2022-09-07 MED ORDER — FAMOTIDINE IN NACL 20-0.9 MG/50ML-% IV SOLN
20.0000 mg | Freq: Once | INTRAVENOUS | Status: AC
  Administered 2022-09-07: 20 mg via INTRAVENOUS
  Filled 2022-09-07: qty 50

## 2022-09-07 MED ORDER — SODIUM CHLORIDE 0.9 % IV SOLN
400.0000 mg | Freq: Once | INTRAVENOUS | Status: AC
  Administered 2022-09-07: 400 mg via INTRAVENOUS
  Filled 2022-09-07: qty 20

## 2022-09-07 MED ORDER — METHYLPREDNISOLONE SODIUM SUCC 125 MG IJ SOLR
125.0000 mg | Freq: Once | INTRAMUSCULAR | Status: AC
  Administered 2022-09-07: 125 mg via INTRAVENOUS
  Filled 2022-09-07: qty 2

## 2022-09-07 MED ORDER — SODIUM CHLORIDE 0.9 % IV SOLN: Freq: Once | INTRAVENOUS | Status: AC

## 2022-09-07 NOTE — Progress Notes (Signed)
Pt presents today for Venofer IV iron infusion per provider's order. Vital signs stable and pt voiced no new complaints at this time.  Peripheral IV started with good blood return pre and post infusion.  Venofer 400 mg  given today per MD orders. Tolerated infusion without adverse affects. Vital signs stable. No complaints at this time. Discharged from clinic ambulatory with walker in stable condition. Alert and oriented x 3. F/U with [facility] as scheduled.

## 2022-09-07 NOTE — Patient Instructions (Signed)
[facility]  Discharge Instructions: Thank you for choosing [facility] to provide your oncology and hematology care.  If you have a lab appointment with the Cancer Center, please come in thru the Main Entrance and check in at the main information desk.  Wear comfortable clothing and clothing appropriate for easy access to any Portacath or PICC line.   We strive to give you quality time with your provider. You may need to reschedule your appointment if you arrive late (15 or more minutes).  Arriving late affects you and other patients whose appointments are after yours.  Also, if you miss three or more appointments without notifying the office, you may be dismissed from the clinic at the provider's discretion.      For prescription refill requests, have your pharmacy contact our office and allow 72 hours for refills to be completed.    Today you received Venofer IV iron infusion.     BELOW ARE SYMPTOMS THAT SHOULD BE REPORTED IMMEDIATELY: *FEVER GREATER THAN 100.4 F (38 C) OR HIGHER *CHILLS OR SWEATING *NAUSEA AND VOMITING THAT IS NOT CONTROLLED WITH YOUR NAUSEA MEDICATION *UNUSUAL SHORTNESS OF BREATH *UNUSUAL BRUISING OR BLEEDING *URINARY PROBLEMS (pain or burning when urinating, or frequent urination) *BOWEL PROBLEMS (unusual diarrhea, constipation, pain near the anus) TENDERNESS IN MOUTH AND THROAT WITH OR WITHOUT PRESENCE OF ULCERS (sore throat, sores in mouth, or a toothache) UNUSUAL RASH, SWELLING OR PAIN  UNUSUAL VAGINAL DISCHARGE OR ITCHING   Items with * indicate a potential emergency and should be followed up as soon as possible or go to the Emergency Department if any problems should occur.  Please show the CHEMOTHERAPY ALERT CARD or IMMUNOTHERAPY ALERT CARD at check-in to the Emergency Department and triage nurse.  Should you have questions after your visit or need to cancel or reschedule your appointment, please contact [facility] [phone]  and follow the prompts.  Office hours are 8:00 a.m. to 4:30 p.m. Monday - Friday. Please note that voicemails left after 4:00 p.m. may not be returned until the following business day.  We are closed weekends and major holidays. You have access to a nurse at all times for urgent questions. Please call the main number to the clinic [phone] and follow the prompts.  For any non-urgent questions, you may also contact your provider using MyChart. We now offer e-Visits for anyone 18 and older to request care online for non-urgent symptoms. For details visit [email or web address].   Also download the MyChart app! Go to the app store, search "MyChart", open the app, select [facility], and log in with your MyChart username and password.  Masks are optional in the cancer centers. If you would like for your care team to wear a mask while they are taking care of you, please let them know. You may have one support person who is at least 60 years old accompany you for your appointments.  

## 2022-09-23 ENCOUNTER — Ambulatory Visit: Payer: Managed Care, Other (non HMO) | Admitting: Nutrition

## 2022-09-28 ENCOUNTER — Inpatient Hospital Stay: Payer: Managed Care, Other (non HMO) | Attending: Hematology

## 2022-09-28 VITALS — BP 132/78 | HR 82 | Temp 98.4°F | Resp 18 | Wt 338.6 lb

## 2022-09-28 DIAGNOSIS — D72820 Lymphocytosis (symptomatic): Secondary | ICD-10-CM | POA: Insufficient documentation

## 2022-09-28 DIAGNOSIS — D508 Other iron deficiency anemias: Secondary | ICD-10-CM

## 2022-09-28 DIAGNOSIS — D509 Iron deficiency anemia, unspecified: Secondary | ICD-10-CM | POA: Insufficient documentation

## 2022-09-28 MED ORDER — SODIUM CHLORIDE 0.9 % IV SOLN
400.0000 mg | Freq: Once | INTRAVENOUS | Status: AC
  Administered 2022-09-28: 400 mg via INTRAVENOUS
  Filled 2022-09-28: qty 20

## 2022-09-28 MED ORDER — METHYLPREDNISOLONE SODIUM SUCC 125 MG IJ SOLR
125.0000 mg | Freq: Once | INTRAMUSCULAR | Status: AC
  Administered 2022-09-28: 125 mg via INTRAVENOUS
  Filled 2022-09-28: qty 2

## 2022-09-28 MED ORDER — FAMOTIDINE IN NACL 20-0.9 MG/50ML-% IV SOLN
20.0000 mg | Freq: Once | INTRAVENOUS | Status: AC
  Administered 2022-09-28: 20 mg via INTRAVENOUS
  Filled 2022-09-28: qty 50

## 2022-09-28 MED ORDER — SODIUM CHLORIDE 0.9 % IV SOLN: Freq: Once | INTRAVENOUS | Status: AC

## 2022-09-28 MED ORDER — DIPHENHYDRAMINE HCL 50 MG/ML IJ SOLN
50.0000 mg | Freq: Once | INTRAMUSCULAR | Status: AC
  Administered 2022-09-28: 50 mg via INTRAVENOUS
  Filled 2022-09-28: qty 1

## 2022-09-28 NOTE — Patient Instructions (Signed)
[facility]  Discharge Instructions: Thank you for choosing [facility] to provide your oncology and hematology care.  If you have a lab appointment with the Cancer Center, please come in thru the Main Entrance and check in at the main information desk.  Wear comfortable clothing and clothing appropriate for easy access to any Portacath or PICC line.   We strive to give you quality time with your provider. You may need to reschedule your appointment if you arrive late (15 or more minutes).  Arriving late affects you and other patients whose appointments are after yours.  Also, if you miss three or more appointments without notifying the office, you may be dismissed from the clinic at the provider's discretion.      For prescription refill requests, have your pharmacy contact our office and allow 72 hours for refills to be completed.    Today you received the following chemotherapy and/or immunotherapy agents Venofer      To help prevent nausea and vomiting after your treatment, we encourage you to take your nausea medication as directed.  BELOW ARE SYMPTOMS THAT SHOULD BE REPORTED IMMEDIATELY: *FEVER GREATER THAN 100.4 F (38 C) OR HIGHER *CHILLS OR SWEATING *NAUSEA AND VOMITING THAT IS NOT CONTROLLED WITH YOUR NAUSEA MEDICATION *UNUSUAL SHORTNESS OF BREATH *UNUSUAL BRUISING OR BLEEDING *URINARY PROBLEMS (pain or burning when urinating, or frequent urination) *BOWEL PROBLEMS (unusual diarrhea, constipation, pain near the anus) TENDERNESS IN MOUTH AND THROAT WITH OR WITHOUT PRESENCE OF ULCERS (sore throat, sores in mouth, or a toothache) UNUSUAL RASH, SWELLING OR PAIN  UNUSUAL VAGINAL DISCHARGE OR ITCHING   Items with * indicate a potential emergency and should be followed up as soon as possible or go to the Emergency Department if any problems should occur.  Please show the CHEMOTHERAPY ALERT CARD or IMMUNOTHERAPY ALERT CARD at check-in to the Emergency  Department and triage nurse.  Should you have questions after your visit or need to cancel or reschedule your appointment, please contact [facility] [phone]  and follow the prompts.  Office hours are 8:00 a.m. to 4:30 p.m. Monday - Friday. Please note that voicemails left after 4:00 p.m. may not be returned until the following business day.  We are closed weekends and major holidays. You have access to a nurse at all times for urgent questions. Please call the main number to the clinic [phone] and follow the prompts.  For any non-urgent questions, you may also contact your provider using MyChart. We now offer e-Visits for anyone 18 and older to request care online for non-urgent symptoms. For details visit [email or web address].   Also download the MyChart app! Go to the app store, search "MyChart", open the app, select [facility], and log in with your MyChart username and password.  Masks are optional in the cancer centers. If you would like for your care team to wear a mask while they are taking care of you, please let them know. You may have one support person who is at least 60 years old accompany you for your appointments.  

## 2022-09-28 NOTE — Progress Notes (Unsigned)
Patient presents today for Venofer infusion per providers order.  Vital signs within parameters for treatment.  Patient has no new complaints at this time.    Peripheral IV started and blood return noted pre and post infusion.    Stable during infusion without adverse affects.  Vital signs stable.  No complaints at this time.  Discharge from clinic ambulatory in stable condition.  Alert and oriented X 3.  Follow up with [facility] as scheduled.  

## 2022-09-30 ENCOUNTER — Encounter: Payer: Self-pay | Admitting: Hematology

## 2022-10-08 ENCOUNTER — Other Ambulatory Visit: Payer: Self-pay | Admitting: Obstetrics and Gynecology

## 2022-10-08 DIAGNOSIS — R928 Other abnormal and inconclusive findings on diagnostic imaging of breast: Secondary | ICD-10-CM

## 2022-10-23 ENCOUNTER — Ambulatory Visit
Admission: RE | Admit: 2022-10-23 | Discharge: 2022-10-23 | Disposition: A | Discharge status: Home / Self Care | Payer: Managed Care, Other (non HMO) | Source: Ambulatory Visit | Attending: Obstetrics and Gynecology | Admitting: Obstetrics and Gynecology

## 2022-10-23 ENCOUNTER — Other Ambulatory Visit: Payer: Self-pay | Admitting: Obstetrics and Gynecology

## 2022-10-23 DIAGNOSIS — R921 Mammographic calcification found on diagnostic imaging of breast: Secondary | ICD-10-CM

## 2022-10-23 DIAGNOSIS — R928 Other abnormal and inconclusive findings on diagnostic imaging of breast: Secondary | ICD-10-CM

## 2022-11-06 ENCOUNTER — Ambulatory Visit
Admission: RE | Admit: 2022-11-06 | Discharge: 2022-11-06 | Disposition: A | Discharge status: Home / Self Care | Payer: Managed Care, Other (non HMO) | Source: Ambulatory Visit | Attending: Obstetrics and Gynecology | Admitting: Obstetrics and Gynecology

## 2022-11-06 DIAGNOSIS — R921 Mammographic calcification found on diagnostic imaging of breast: Secondary | ICD-10-CM

## 2022-11-06 HISTORY — PX: BREAST BIOPSY: SHX20

## 2022-11-13 ENCOUNTER — Other Ambulatory Visit: Payer: Self-pay | Admitting: Gastroenterology

## 2022-11-23 ENCOUNTER — Ambulatory Visit
Admission: RE | Admit: 2022-11-23 | Discharge: 2022-11-23 | Disposition: A | Discharge status: Home / Self Care | Payer: Managed Care, Other (non HMO) | Source: Ambulatory Visit | Attending: Family Medicine | Admitting: Family Medicine

## 2022-11-23 ENCOUNTER — Other Ambulatory Visit: Payer: Self-pay | Admitting: Family Medicine

## 2022-11-23 DIAGNOSIS — M25551 Pain in right hip: Secondary | ICD-10-CM

## 2022-12-16 ENCOUNTER — Encounter (HOSPITAL_COMMUNITY): Payer: Self-pay | Admitting: Gastroenterology

## 2022-12-21 NOTE — H&P (Signed)
History of Present Illness General:         62 year old female, previous patient Dr. Oletta Lamas, with history of Morbid Obesity (BMI 58) presents for 2 month f/u of Anemia, Constipation, and GERD. S he has history of Thalassemia and Iron Deficiency Anemia.  She denies rectal bleeding or black stools.  She has some generalized upper abdominal pain, epigastric pain, and bad heartburn on PPI & H2RB.  She takes Meloxicam every day and Ibuprofen every 2 days.        -Labs 04/13/22 showed Hgb 10.4g, MCV 67.6, Plt 277, Ferritin 25, Iron 49, Iron Sat 13%. Iron Tablet was increased to twice daily.  Hemoccult Cards were Negative x 6.  Cologuard was ordered (Not completed).        -Colonoscopy by Dr. Oletta Lamas 09/2011 was Normal.  She is Due for a 10 year repeat screening Colon.  No family history of colon cancer.        Medical history significant for morbid obesity (BMI 58), diabetes, chronic thalassemia anemia, and hypertension.  She does not want to do a traditional colonoscopy.  She is afraid of being sedated and afraid of complications due to morbid obesity. S he has significant knee osteoarthritis.  Ambulation is difficult. She walks with a walker.        CONSTIPATION: She tried MiraLAX which did not work. Was started on samples of Linzess 2 months ago. She is currently taking Linzess 290 mcg daily with good control of constipation and needs med refill.       GERD: She takes pantoprazole 40 milligrams once daily AQM and Famotidine '20mg'$  once da ily APM. She wants to increase famotidine to '40mg'$  daily and switch timing of meds (PPI in evening and H2RB in morning).  Current Medications Taking Ibuprofen 200 MG Tablet 1 tablet with food or milk as needed Orally Three times a day Accu-Chek Guide(Blood Glucose Test) w/Device Kit use to check fasting blood sugar finger stick every morning. Dx: E11.9 Accu-Chek Guide test strips * test strips use to check fasting blood sugar finger stick every morning. Dx:  E11.9 Accu-Chek Softclix Lancets - Miscellaneous use to check fasting blood suagr finger stick every morning. Dx: E11.9 Atorvastatin Calcium 10 MG Tablet take 1 tablet by mouth every day Famotidine 20 MG Tablet 1 tablet at bedtime as needed Orally Once a day Flexeril(Cyclobenzaprine HCl) 10 MG Tablet 1 tablet as needed Orally q hs prn back pain Fluticasone Propionate 50 MCG/ACT Suspension USE 1 SPRAY IN EACH NOSTRIL AS NEEDED hydroCHLOROthiazide 12.5 MG Capsule take 1 capsule by mouth every day in the morning Linzess(linaCLOtide) 290 MCG Capsule 1 capsule at least 30 minutes before the first meal of the day on an empty stomach Orally Once a day Lisinopril 10 MG Tablet 1 tablet Orally Once a day for BP metFORMIN HCl ER 500 MG Tablet Extended Release 24 Hour Take 2 tablets with meal in the AM and 1 tablet with meal in the PM by mouth Mobic(Meloxicam) 7.5 MG Tablet 1 tablet Orally Once a day Pantoprazole Sodium 40 MG Tablet Delayed Release 1 tablet Orally Once a day ZyrTEC Allergy(Cetirizine HCl) 10 MG Tablet 1 tablet Orally Once a day Vitamin D 2000 UNIT Capsule 1 capsule Orally Once a day Magnesium 200 MG Tablet 1 capsule with food Orally Once a day Calcium 500 MG Tablet 1 tablet with meals Orally Twice a day Vitamin E 400 UNIT Capsule 1 capsule Orally Once a day Not-Taking One Touch Ultra Lancets . delica lancets Use  to obtain blood specimen Needle twice daily OneTouch Verio(Blood Glucose Test) Test Strip 1 strip fingerstick Once a day dxR73.09 Medication List reviewed and reconciled with the patient Past Medical History      Diabetes - new onset in 2023, progression from prediabetes.      HTN.      anemia - Thalessemia per hematology.      diabetes/ Insulin Resistant.      GYN care with [employer] Regal.      Hip and knee pain Delilah Shan). Surgical History       hysterectomy-still has ovaries 07/2004 Family History Father: alive, leukemia, diagnosed with Diabetes, Hypertension Mother:  alive, hypercholesterolemia, diagnosed with Hypertension Brother 1: alive, hypercholesterolemia, diagnosed with Diabetes, Hypertension Sister 1: alive, breast cancer Sister 2: alive 1 brother(s) , 2 sister(s) . neg Gi family hx No Family History of Colon Cancer, Polyps, or Liver Disease. Social History General:  Tobacco use      cigarettes:  Never smoked     Tobacco history last updated  06/08/2022     Vaping  No no EXPOSURE TO PASSIVE SMOKE. no Alcohol. Caffeine: once a day. no Recreational drug use. Exercise: once a week. DENTAL CARE: good. Marital Status: married. Children: none. 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 Allergies Penicillin (for allergy) sulfa eye drops tobramycin eye drops ophcon A Morphine Sulfate Hospitalization/Major Diagnostic Procedure Surgery none within last year 05/2022 Review of Systems GI PROCEDURE:         Pacemaker/ AICD no.  Artificial heart valves no.  MI/heart attack no.  Abnormal heart rhythm no.  Angina no.  CVA no.  Hypertension YES.  Hypotension no.  Asthma, COPD no.  Sleep apnea no.  Seizure disorders no.  Artificial joints no.  Diabetes ]YES, type II.  Significant headaches no.  Vertigo no.  Depression/anxiety no.  Abnormal bleeding no.  Kidney Disease no.  Liver disease no.  Blood transfusion no   Vital Signs Wt 343.2, Wt change -1.8 lbs, Ht 64, BMI 58.9, Temp 96.1, Pulse sitting 67, BP sitting 122/81.ExaminationGastroenterology Exam:        GENERAL APPEARANCE: Well developed, well nourished, Morbidly obese; no active distress, pleasant, no acute distress; Walks with a walker.Marland Kitchen         SCLERA: anicteric.         RESPIRATORY Breath sounds clear to auscultation. No wheezes, rales or rhonchi. Respiration even and unlabored.         CARDIOVASCULAR Normal RRR w/o murmers or gallops. No peripheral edema.        ABDOMEN No masses palpated. Liver and spleen not palpated, normal. Bowel sounds normal, Abdomen soft; morbidly obese; Mild  epigastric tenderness; No lower abdominal tenderness.Marland Kitchen         PSYCHIATRIC Alert and oriented x3, mood and affect appear normal..    Assessments 1. Iron deficiency anemia - D50.9 (Primary) 2. Thalassemia, unspecified - D56.9 3. Morbid obesity - E66.01 4. Constipation - K59.00 5. GERD (gastroesophageal reflux disease) - K21.9   Treatment 1. Iron deficiency anemia       LAB: CBC with Diff      LAB: Ferritin      LAB: Iron Panel      IMAGING: Colon/EGD              Patient expressed understanding and agrees to proceed with procedures.Schedule EGD / Colon in Hospital due to BMI 58 (>50)..    2. Morbid obesity Notes: Schedule EGD / Colon in Hospital due to BMI 58 (>  50).    3. Constipation Stop Linzess 145 mcg Capsule, ., 1 capsule, orally, once a day with first mealStart Linzess Capsule, 290 MCG, 1 capsule at least 30 minutes before the first meal of the day on an empty stomach, Orally, Once a day, 90 days, 90, Refills 3  4. GERD (gastroesophageal reflux disease)  Continue Pantoprazole Sodium Tablet Delayed Release, 40 MG, 1 tablet, Orally, Once a day, 90 days, 90 Tablet, Refills 3  Increase Famotidine Tablet, 40 MG, 1 tablet, by mouth, Once a day, 90 days, 90 Tablet, Refills 3

## 2022-12-22 ENCOUNTER — Encounter (HOSPITAL_COMMUNITY)
Admission: RE | Disposition: A | Discharge status: Home / Self Care | Payer: Self-pay | Source: Home / Self Care | Attending: Gastroenterology

## 2022-12-22 ENCOUNTER — Encounter (HOSPITAL_COMMUNITY): Payer: Self-pay | Admitting: Gastroenterology

## 2022-12-22 ENCOUNTER — Ambulatory Visit (HOSPITAL_COMMUNITY): Payer: Managed Care, Other (non HMO) | Admitting: Certified Registered Nurse Anesthetist

## 2022-12-22 ENCOUNTER — Ambulatory Visit (HOSPITAL_COMMUNITY)
Admission: RE | Admit: 2022-12-22 | Discharge: 2022-12-22 | Disposition: A | Discharge status: Home / Self Care | Payer: Managed Care, Other (non HMO) | Attending: Gastroenterology | Admitting: Gastroenterology

## 2022-12-22 ENCOUNTER — Other Ambulatory Visit: Payer: Self-pay

## 2022-12-22 ENCOUNTER — Ambulatory Visit (HOSPITAL_BASED_OUTPATIENT_CLINIC_OR_DEPARTMENT_OTHER): Payer: Managed Care, Other (non HMO) | Admitting: Certified Registered Nurse Anesthetist

## 2022-12-22 DIAGNOSIS — K2950 Unspecified chronic gastritis without bleeding: Secondary | ICD-10-CM | POA: Diagnosis not present

## 2022-12-22 DIAGNOSIS — D124 Benign neoplasm of descending colon: Secondary | ICD-10-CM | POA: Insufficient documentation

## 2022-12-22 DIAGNOSIS — D123 Benign neoplasm of transverse colon: Secondary | ICD-10-CM

## 2022-12-22 DIAGNOSIS — K449 Diaphragmatic hernia without obstruction or gangrene: Secondary | ICD-10-CM | POA: Insufficient documentation

## 2022-12-22 DIAGNOSIS — R12 Heartburn: Secondary | ICD-10-CM | POA: Diagnosis not present

## 2022-12-22 DIAGNOSIS — I10 Essential (primary) hypertension: Secondary | ICD-10-CM

## 2022-12-22 DIAGNOSIS — Z7984 Long term (current) use of oral hypoglycemic drugs: Secondary | ICD-10-CM | POA: Diagnosis not present

## 2022-12-22 DIAGNOSIS — E119 Type 2 diabetes mellitus without complications: Secondary | ICD-10-CM | POA: Diagnosis not present

## 2022-12-22 DIAGNOSIS — K648 Other hemorrhoids: Secondary | ICD-10-CM | POA: Diagnosis not present

## 2022-12-22 DIAGNOSIS — M1990 Unspecified osteoarthritis, unspecified site: Secondary | ICD-10-CM

## 2022-12-22 DIAGNOSIS — R1013 Epigastric pain: Secondary | ICD-10-CM | POA: Diagnosis not present

## 2022-12-22 DIAGNOSIS — Z1211 Encounter for screening for malignant neoplasm of colon: Secondary | ICD-10-CM | POA: Diagnosis present

## 2022-12-22 DIAGNOSIS — K219 Gastro-esophageal reflux disease without esophagitis: Secondary | ICD-10-CM | POA: Diagnosis not present

## 2022-12-22 DIAGNOSIS — D509 Iron deficiency anemia, unspecified: Secondary | ICD-10-CM

## 2022-12-22 DIAGNOSIS — Z6841 Body Mass Index (BMI) 40.0 and over, adult: Secondary | ICD-10-CM | POA: Diagnosis not present

## 2022-12-22 HISTORY — PX: POLYPECTOMY: SHX5525

## 2022-12-22 HISTORY — PX: BIOPSY: SHX5522

## 2022-12-22 HISTORY — PX: COLONOSCOPY WITH PROPOFOL: SHX5780

## 2022-12-22 HISTORY — PX: ESOPHAGOGASTRODUODENOSCOPY (EGD) WITH PROPOFOL: SHX5813

## 2022-12-22 LAB — GLUCOSE, CAPILLARY: Glucose-Capillary: 94 mg/dL (ref 70–99)

## 2022-12-22 SURGERY — ESOPHAGOGASTRODUODENOSCOPY (EGD) WITH PROPOFOL
Anesthesia: Monitor Anesthesia Care

## 2022-12-22 MED ORDER — SODIUM CHLORIDE 0.9 % IV SOLN: INTRAVENOUS | Status: DC

## 2022-12-22 MED ORDER — LIDOCAINE 2% (20 MG/ML) 5 ML SYRINGE
INTRAMUSCULAR | Status: DC | PRN
  Administered 2022-12-22: 100 mg via INTRAVENOUS

## 2022-12-22 MED ORDER — PHENYLEPHRINE 80 MCG/ML (10ML) SYRINGE FOR IV PUSH (FOR BLOOD PRESSURE SUPPORT)
PREFILLED_SYRINGE | INTRAVENOUS | Status: DC | PRN
  Administered 2022-12-22: 80 ug via INTRAVENOUS

## 2022-12-22 MED ORDER — ONDANSETRON HCL 4 MG/2ML IJ SOLN
INTRAMUSCULAR | Status: DC | PRN
  Administered 2022-12-22: 4 mg via INTRAVENOUS

## 2022-12-22 MED ORDER — LACTATED RINGERS IV SOLN: INTRAVENOUS | Status: DC

## 2022-12-22 MED ORDER — PROPOFOL 500 MG/50ML IV EMUL
INTRAVENOUS | Status: DC | PRN
  Administered 2022-12-22: 150 ug/kg/min via INTRAVENOUS

## 2022-12-22 MED ORDER — PROPOFOL 10 MG/ML IV BOLUS
INTRAVENOUS | Status: DC | PRN
  Administered 2022-12-22: 20 mg via INTRAVENOUS
  Administered 2022-12-22: 50 mg via INTRAVENOUS
  Administered 2022-12-22 (×2): 20 mg via INTRAVENOUS
  Administered 2022-12-22: 30 mg via INTRAVENOUS

## 2022-12-22 SURGICAL SUPPLY — 25 items

## 2022-12-22 NOTE — Anesthesia Procedure Notes (Signed)
Procedure Name: MAC Date/Time: 12/22/2022 10:34 AM  Performed by: Deliah Boston, CRNAPre-anesthesia Checklist: Patient identified, Emergency Drugs available, Suction available and Patient being monitored Patient Re-evaluated:Patient Re-evaluated prior to induction Oxygen Delivery Method: Simple face mask Preoxygenation: Pre-oxygenation with 100% oxygen Placement Confirmation: positive ETCO2 and breath sounds checked- equal and bilateral

## 2022-12-22 NOTE — Transfer of Care (Signed)
Immediate Anesthesia Transfer of Care Note  Patient: Allison Henderson  Procedure(s) Performed: Procedure(s) with comments: ESOPHAGOGASTRODUODENOSCOPY (EGD) WITH PROPOFOL (N/A) COLONOSCOPY WITH PROPOFOL (N/A) BIOPSY POLYPECTOMY - polypectomy using cold forceps  Patient Location: PACU  Anesthesia Type:MAC  Level of Consciousness: Patient easily awoken, sedated, comfortable, cooperative, following commands, responds to stimulation.   Airway & Oxygen Therapy: Patient spontaneously breathing, ventilating well, oxygen via simple oxygen mask.  Post-op Assessment: Report given to PACU RN, vital signs reviewed and stable, moving all extremities.   Post vital signs: Reviewed and stable.  Complications: No apparent anesthesia complications  Last Vitals:  Vitals Value Taken Time  BP 108/31 12/22/22 1110  Temp    Pulse 83 12/22/22 1110  Resp 22 12/22/22 1110  SpO2 99 % 12/22/22 1110  Vitals shown include unvalidated device data.  Last Pain:  Vitals:   12/22/22 0939  TempSrc: Temporal  PainSc: 8          Complications: No notable events documented.

## 2022-12-22 NOTE — Op Note (Signed)
[facility] Patient Name: Allison Henderson Procedure Date: 12/22/2022 MRN: [id number] Attending MD: Kerin Salen , MD, [id number] Date of Birth: 1961-04-11 CSN: [id number] Age: 62 Admit Type: Outpatient Procedure:                Colonoscopy Indications:              Screening for colorectal malignant neoplasm, Last                            colonoscopy: 2012, Incidental - Iron deficiency                            anemia Providers:                Kerin Salen, MD, Delton Prairie, RN, Irene Shipper,                            Technician Referring MD:             Warren Danes Medicines:                Monitored Anesthesia Care Complications:            No immediate complications. Estimated Blood Loss:     Estimated blood loss was minimal. Procedure:                Pre-Anesthesia Assessment:                           - Prior to the procedure, a History and Physical                            was performed, and patient medications and                            allergies were reviewed. The patient's tolerance of                            previous anesthesia was also reviewed. The risks                            and benefits of the procedure and the sedation                            options and risks were discussed with the patient.                            All questions were answered, and informed consent                            was obtained. Prior Anticoagulants: The patient has                            taken no anticoagulant or antiplatelet agents. ASA                            Grade Assessment: III -  A patient with severe                            systemic disease. After reviewing the risks and                            benefits, the patient was deemed in satisfactory                            condition to undergo the procedure.                           - Prior to the procedure, a History and Physical                            was performed, and patient medications  and                            allergies were reviewed. The patient's tolerance of                            previous anesthesia was also reviewed. The risks                            and benefits of the procedure and the sedation                            options and risks were discussed with the patient.                            All questions were answered, and informed consent                            was obtained. Prior Anticoagulants: The patient has                            taken no anticoagulant or antiplatelet agents. ASA                            Grade Assessment: III - A patient with severe                            systemic disease. After reviewing the risks and                            benefits, the patient was deemed in satisfactory                            condition to undergo the procedure.                           After obtaining informed consent, the colonoscope  was passed under direct vision. Throughout the                            procedure, the patient's blood pressure, pulse, and                            oxygen saturations were monitored continuously. The                            [id number]-HQ190L ([id number]) Olympus colonoscope was                            introduced through the anus and advanced to the the                            terminal ileum. The colonoscopy was performed                            without difficulty. The patient tolerated the                            procedure well. The quality of the bowel                            preparation was good. The terminal ileum, ileocecal                            valve, appendiceal orifice, and rectum were                            photographed. Scope In: 10:44:48 AM Scope Out: 11:00:35 AM Scope Withdrawal Time: 0 hours 8 minutes 36 seconds  Total Procedure Duration: 0 hours 15 minutes 47 seconds  Findings:      The perianal and digital rectal examinations were  normal.      The terminal ileum appeared normal.      A 6 mm polyp was found in the transverse colon. The polyp was sessile.       The polyp was removed with a piecemeal technique using a cold biopsy       forceps. Resection and retrieval were complete.      Two sessile polyps were found in the descending colon. The polyps were 3       to 4 mm in size. These polyps were removed with a piecemeal technique       using a cold biopsy forceps. Resection and retrieval were complete.      Non-bleeding internal hemorrhoids were found during retroflexion.      The exam was otherwise without abnormality. Impression:               - The examined portion of the ileum was normal.                           - One 6 mm polyp in the transverse colon, removed  piecemeal using a cold biopsy forceps. Resected and                            retrieved.                           - Two 3 to 4 mm polyps in the descending colon,                            removed piecemeal using a cold biopsy forceps.                            Resected and retrieved.                           - Non-bleeding internal hemorrhoids.                           - The examination was otherwise normal. Moderate Sedation:      Patient did not receive moderate sedation for this procedure, but       instead received monitored anesthesia care. Recommendation:           - Patient has a contact number available for                            emergencies. The signs and symptoms of potential                            delayed complications were discussed with the                            patient. Return to normal activities tomorrow.                            Written discharge instructions were provided to the                            patient.                           - Resume regular diet.                           - Continue present medications.                           - Await pathology results.                            - Repeat colonoscopy for surveillance based on                            pathology results. Procedure Code(s):        --- Professional ---                           [id number],  Colonoscopy, flexible; with biopsy, single                            or multiple Diagnosis Code(s):        --- Professional ---                           Z12.11, Encounter for screening for malignant                            neoplasm of colon                           K64.8, Other hemorrhoids                           D12.3, Benign neoplasm of transverse colon (hepatic                            flexure or splenic flexure)                           D12.4, Benign neoplasm of descending colon CPT copyright 2022 American [employer] Association. All rights reserved. The codes documented in this report are preliminary and upon coder review may  be revised to meet current compliance requirements. Kerin Salen, MD 12/22/2022 11:08:14 AM This report has been signed electronically. Number of Addenda: 0

## 2022-12-22 NOTE — Op Note (Signed)
[facility] Patient Name: Allison Henderson Procedure Date: 12/22/2022 MRN: [id number] Attending MD: Kerin Salen , MD, [id number] Date of Birth: 01/21/1961 CSN: [id number] Age: 61 Admit Type: Outpatient Procedure:                Upper GI endoscopy Indications:              Epigastric abdominal pain, Iron deficiency anemia,                            Heartburn Providers:                Kerin Salen, MD, Delton Prairie, RN, Irene Shipper,                            Technician Referring MD:             Warren Danes Medicines:                Monitored Anesthesia Care Complications:            No immediate complications. Estimated blood loss:                            Minimal. Estimated Blood Loss:     Estimated blood loss was minimal. Procedure:                Pre-Anesthesia Assessment:                           - Prior to the procedure, a History and Physical                            was performed, and patient medications and                            allergies were reviewed. The patient's tolerance of                            previous anesthesia was also reviewed. The risks                            and benefits of the procedure and the sedation                            options and risks were discussed with the patient.                            All questions were answered, and informed consent                            was obtained. Prior Anticoagulants: The patient has                            taken no anticoagulant or antiplatelet agents. ASA                            Grade Assessment: III -  A patient with severe                            systemic disease. After reviewing the risks and                            benefits, the patient was deemed in satisfactory                            condition to undergo the procedure.                           After obtaining informed consent, the endoscope was                            passed under direct vision. Throughout the                             procedure, the patient's blood pressure, pulse, and                            oxygen saturations were monitored continuously. The                            [id number]-[id number] ([id number]) Olympus endoscope was introduced                            through the mouth, and advanced to the second part                            of duodenum. The upper GI endoscopy was                            accomplished without difficulty. The patient                            tolerated the procedure well. Scope In: Scope Out: Findings:      The examined esophagus was normal.      The Z-line was regular and was found 40 cm from the incisors.      A 1 cm hiatal hernia was present.      The entire examined stomach was normal. Biopsies were taken with a cold       forceps for Helicobacter pylori testing.      The cardia and gastric fundus were normal on retroflexion.      The examined duodenum was normal. Biopsies for histology were taken with       a cold forceps for evaluation of celiac disease. Impression:               - Normal esophagus.                           - Z-line regular, 40 cm from the incisors.                           - 1  cm hiatal hernia.                           - Normal stomach. Biopsied.                           - Normal examined duodenum. Biopsied. Moderate Sedation:      Patient did not receive moderate sedation for this procedure, but       instead received monitored anesthesia care. Recommendation:           - Patient has a contact number available for                            emergencies. The signs and symptoms of potential                            delayed complications were discussed with the                            patient. Return to normal activities tomorrow.                            Written discharge instructions were provided to the                            patient.                           - Resume regular diet.                           -  Continue present medications.                           - Await pathology results. Procedure Code(s):        --- Professional ---                           [phone], Esophagogastroduodenoscopy, flexible,                            transoral; with biopsy, single or multiple Diagnosis Code(s):        --- Professional ---                           K44.9, Diaphragmatic hernia without obstruction or                            gangrene                           R10.13, Epigastric pain                           D50.9, Iron deficiency anemia, unspecified                           R12, Heartburn CPT copyright  2022 American Medical Association. All rights reserved. The codes documented in this report are preliminary and upon coder review may  be revised to meet current compliance requirements. Kerin Salen, MD 12/22/2022 11:05:00 AM This report has been signed electronically. Number of Addenda: 0

## 2022-12-22 NOTE — Progress Notes (Signed)
Pt had manjouro injection on Wednesday.  Has not been off an entire 7 days.  Dr Therisa Doyne is ok to proceed.

## 2022-12-22 NOTE — Anesthesia Postprocedure Evaluation (Signed)
Anesthesia Post Note  Patient: Hart Rochester  Procedure(s) Performed: ESOPHAGOGASTRODUODENOSCOPY (EGD) WITH PROPOFOL COLONOSCOPY WITH PROPOFOL BIOPSY POLYPECTOMY     Patient location during evaluation: PACU Anesthesia Type: MAC Level of consciousness: awake and alert Pain management: pain level controlled Vital Signs Assessment: post-procedure vital signs reviewed and stable Respiratory status: spontaneous breathing, nonlabored ventilation, respiratory function stable and patient connected to nasal cannula oxygen Cardiovascular status: stable and blood pressure returned to baseline Postop Assessment: no apparent nausea or vomiting Anesthetic complications: no  No notable events documented.  Last Vitals:  Vitals:   12/22/22 1130 12/22/22 1140  BP: 133/65 122/61  Pulse: 74 66  Resp: (!) 24 14  Temp:    SpO2: 100% 100%    Last Pain:  Vitals:   12/22/22 1140  TempSrc:   PainSc: 0-No pain                 Effie Berkshire

## 2022-12-22 NOTE — Discharge Instructions (Signed)

## 2022-12-22 NOTE — Anesthesia Postprocedure Evaluation (Deleted)
Anesthesia Post Note  Patient: Allison Henderson  Procedure(s) Performed: ESOPHAGOGASTRODUODENOSCOPY (EGD) WITH PROPOFOL COLONOSCOPY WITH PROPOFOL BIOPSY POLYPECTOMY     Patient location during evaluation: PACU Anesthesia Type: MAC Level of consciousness: awake and alert Pain management: pain level controlled Vital Signs Assessment: post-procedure vital signs reviewed and stable Respiratory status: spontaneous breathing, nonlabored ventilation, respiratory function stable and patient connected to nasal cannula oxygen Cardiovascular status: stable and blood pressure returned to baseline Postop Assessment: no apparent nausea or vomiting Anesthetic complications: no  No notable events documented.  Last Vitals:  Vitals:   12/22/22 0939  BP: 123/61  Pulse: 86  Resp: (!) 23  Temp: 36.6 C  SpO2: 99%    Last Pain:  Vitals:   12/22/22 0939  TempSrc: Temporal  PainSc: Abeytas Insiya Oshea

## 2022-12-22 NOTE — Anesthesia Preprocedure Evaluation (Addendum)
Anesthesia Evaluation  Patient identified by MRN, date of birth, ID band Patient awake    Reviewed: Allergy & Precautions, NPO status , Patient's Chart, lab work & pertinent test results  Airway Mallampati: II  TM Distance: >3 FB Neck ROM: Full    Dental  (+) Poor Dentition, Missing, Dental Advisory Given   Pulmonary neg pulmonary ROS    + decreased breath sounds      Cardiovascular hypertension, Pt. on medications  Rhythm:Regular Rate:Normal  Echo: - Left ventricle: The cavity size was normal. Wall thickness was    normal. Systolic function was vigorous. The estimated ejection    fraction was in the range of 65% to 70%. Wall motion was normal;    there were no regional wall motion abnormalities. Doppler    parameters are consistent with abnormal left ventricular    relaxation (grade 1 diastolic dysfunction).  - Aortic valve: Valve area (VTI): 3.19 cm^2. Valve area (Vmax):    2.53 cm^2. Valve area (Vmean): 2.42 cm^2.  - Technically adequate study.     Neuro/Psych  Neuromuscular disease  negative psych ROS   GI/Hepatic Neg liver ROS,GERD  Medicated,,  Endo/Other  diabetes, Type 2, Oral Hypoglycemic Agents    Renal/GU negative Renal ROS     Musculoskeletal  (+) Arthritis ,    Abdominal  (+) + obese  Peds  Hematology negative hematology ROS (+)   Anesthesia Other Findings   Reproductive/Obstetrics                             Anesthesia Physical Anesthesia Plan  ASA: 3  Anesthesia Plan: General   Post-op Pain Management: Minimal or no pain anticipated   Induction: Intravenous  PONV Risk Score and Plan: 0 and Propofol infusion  Airway Management Planned: Natural Airway and Simple Face Mask  Additional Equipment: None  Intra-op Plan:   Post-operative Plan:   Informed Consent: I have reviewed the patients History and Physical, chart, labs and discussed the procedure including  the risks, benefits and alternatives for the proposed anesthesia with the patient or authorized representative who has indicated his/her understanding and acceptance.     Dental advisory given  Plan Discussed with: CRNA  Anesthesia Plan Comments: (Consented for GA w/ ETT also. )       Anesthesia Quick Evaluation

## 2022-12-25 LAB — SURGICAL PATHOLOGY

## 2022-12-27 ENCOUNTER — Encounter (HOSPITAL_COMMUNITY): Payer: Self-pay | Admitting: Gastroenterology

## 2023-01-04 ENCOUNTER — Ambulatory Visit: Payer: Managed Care, Other (non HMO) | Admitting: Hematology

## 2023-01-04 ENCOUNTER — Other Ambulatory Visit: Payer: Managed Care, Other (non HMO)

## 2023-02-03 ENCOUNTER — Inpatient Hospital Stay (HOSPITAL_BASED_OUTPATIENT_CLINIC_OR_DEPARTMENT_OTHER): Payer: Managed Care, Other (non HMO) | Admitting: Hematology

## 2023-02-03 ENCOUNTER — Inpatient Hospital Stay: Payer: Managed Care, Other (non HMO) | Attending: Hematology

## 2023-02-03 VITALS — BP 133/85 | HR 83 | Temp 98.2°F | Resp 16 | Wt 311.3 lb

## 2023-02-03 DIAGNOSIS — D508 Other iron deficiency anemias: Secondary | ICD-10-CM

## 2023-02-03 DIAGNOSIS — D72820 Lymphocytosis (symptomatic): Secondary | ICD-10-CM | POA: Insufficient documentation

## 2023-02-03 DIAGNOSIS — R779 Abnormality of plasma protein, unspecified: Secondary | ICD-10-CM | POA: Diagnosis not present

## 2023-02-03 DIAGNOSIS — Z800 Family history of malignant neoplasm of digestive organs: Secondary | ICD-10-CM | POA: Diagnosis not present

## 2023-02-03 DIAGNOSIS — Z79899 Other long term (current) drug therapy: Secondary | ICD-10-CM | POA: Diagnosis not present

## 2023-02-03 DIAGNOSIS — Z803 Family history of malignant neoplasm of breast: Secondary | ICD-10-CM | POA: Insufficient documentation

## 2023-02-03 DIAGNOSIS — D509 Iron deficiency anemia, unspecified: Secondary | ICD-10-CM | POA: Insufficient documentation

## 2023-02-03 DIAGNOSIS — F5089 Other specified eating disorder: Secondary | ICD-10-CM | POA: Diagnosis not present

## 2023-02-03 DIAGNOSIS — Z806 Family history of leukemia: Secondary | ICD-10-CM | POA: Diagnosis not present

## 2023-02-03 DIAGNOSIS — Z807 Family history of other malignant neoplasms of lymphoid, hematopoietic and related tissues: Secondary | ICD-10-CM | POA: Insufficient documentation

## 2023-02-03 DIAGNOSIS — K760 Fatty (change of) liver, not elsewhere classified: Secondary | ICD-10-CM | POA: Insufficient documentation

## 2023-02-03 LAB — CBC WITH DIFFERENTIAL/PLATELET
Abs Immature Granulocytes: 0.04 10*3/uL (ref 0.00–0.07)
Basophils Absolute: 0 10*3/uL (ref 0.0–0.1)
Basophils Relative: 0 %
Eosinophils Absolute: 0.1 10*3/uL (ref 0.0–0.5)
Eosinophils Relative: 1 %
HCT: 36 % (ref 36.0–46.0)
Hemoglobin: 11 g/dL — ABNORMAL LOW (ref 12.0–15.0)
Immature Granulocytes: 1 %
Lymphocytes Relative: 30 %
Lymphs Abs: 2.5 10*3/uL (ref 0.7–4.0)
MCH: 21.7 pg — ABNORMAL LOW (ref 26.0–34.0)
MCHC: 30.6 g/dL (ref 30.0–36.0)
MCV: 71.1 fL — ABNORMAL LOW (ref 80.0–100.0)
Monocytes Absolute: 0.8 10*3/uL (ref 0.1–1.0)
Monocytes Relative: 10 %
Neutro Abs: 5 10*3/uL (ref 1.7–7.7)
Neutrophils Relative %: 58 %
Platelets: 235 10*3/uL (ref 150–400)
RBC: 5.06 MIL/uL (ref 3.87–5.11)
RDW: 16.1 % — ABNORMAL HIGH (ref 11.5–15.5)
WBC: 8.4 10*3/uL (ref 4.0–10.5)
nRBC: 0 % (ref 0.0–0.2)

## 2023-02-03 LAB — IRON AND TIBC
Iron: 59 ug/dL (ref 28–170)
Saturation Ratios: 20 % (ref 10.4–31.8)
TIBC: 299 ug/dL (ref 250–450)
UIBC: 240 ug/dL

## 2023-02-03 LAB — FERRITIN: Ferritin: 114 ng/mL (ref 11–307)

## 2023-02-03 NOTE — Progress Notes (Signed)
[facility] [address] [place] [address]    Clinic Day:  02/03/2023  Referring physician: Maurice Small, MD  Patient Care Team: Irven Coe, MD as PCP - General (Family Medicine) Doreatha Massed, MD as Medical Oncologist (Hematology)   ASSESSMENT & PLAN:   Assessment: 1.  Microcytic anemia: - Patient seen at the request of Celso Amy, PA at [facility] gastroenterology. - CBC (06/10/2022): Hb-9.9, MCV-66, ALC-3000 (1100-2700), ferritin-31, percent saturation-17 - CBC (04/13/2022): Hb-10.4, MCV-67, ferritin-25, percent saturation-13 - CBC (01/29/2021) Hb-10.3, ALC increased at 3.6. - Last colonoscopy in December 2012, normal. - CTAP (07/08/2022): Fatty liver.  Spleen size normal. - No prior history of transfusion/parenteral iron.  Ice pica present. - BMBX in [place] normal several years ago. - Patient on Vitron-C daily for the last 1 month.   2.  Social/family history: - Seen with her husband Molly Maduro today.  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  She is a Lawyer.  Non-smoker. - Niece has sickle cell trait.  Father had chronic leukemia.  Sister had breast cancer at age 7.  Maternal grandmother had lymphoma.  Plan: 1.  Microcytic anemia: - She is on Protonix which decreases oral iron absorption. - She received Venofer 400 mg on 09/07/2022 and 11/29/2021. - She recently had a colonoscopy.  Tubular adenoma and hyperplastic polyp were removed. - Labs today shows hemoglobin improved to 11.  Ferritin is 114 and percent saturation 20.  No indication for parenteral iron therapy at this time. - RTC 6 months with repeat labs including ferritin and iron panel.  Will schedule for parenteral iron on the same day if needed.   2.  Elevated kappa light chains: - SPEP is negative.  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 - Will repeat free light chains and immunofixation at next visit in 6 months.   3.   Lymphocytosis: - Flow cytometry did not show any aberrant B or T-cell population.  No further workup needed.   Orders Placed This Encounter  Procedures   CBC with Differential    Standing Status:   Future    Standing Expiration Date:   02/03/2024   Comprehensive metabolic panel    Standing Status:   Future    Standing Expiration Date:   02/03/2024   Iron and TIBC ([facility] ONLY)    Standing Status:   Future    Standing Expiration Date:   02/03/2024   Ferritin    Standing Status:   Future    Standing Expiration Date:   02/03/2024   Kappa/lambda light chains    Standing Status:   Future    Standing Expiration Date:   02/03/2024   Immunofixation electrophoresis    Standing Status:   Future    Standing Expiration Date:   02/03/2024   Protein electrophoresis, serum    Standing Status:   Future    Standing Expiration Date:   02/03/2024    I,Alexis Herring,acting as a scribe for [employer], MD.,have documented all relevant documentation on the behalf of Doreatha Massed, MD,as directed by  Doreatha Massed, MD while in the presence of Doreatha Massed, MD.   I, Doreatha Massed MD, have reviewed the above documentation for accuracy and completeness, and I agree with the above.   Doreatha Massed, MD   4/17/20246:06 PM  CHIEF COMPLAINT:   Diagnosis: microcytic anemia   Prior Therapy: Venofer  Current Therapy: oral iron supplement  HISTORY OF PRESENT  ILLNESS:   Oncology History   No history exists.     INTERVAL HISTORY:   Francile is a 62 y.o. female presenting to clinic today for follow up of microcytic anemia. She was last seen by me on 08/14/22 for consult with telephone visit follow up on 08/31/22.  She had normal EGD and colonoscopy with polypectomy on 12/22/22. Biopsies negative for high-grade dysplasia or malignancy.  Today, she states that she is doing well overall. Her appetite level is at 60%. Her energy level is at 60%. She  reports some improvement of her energy levels with Venofer- she denies any negative side effects. She denies any obvious bleeding with bowel movements or urination. She continues to need Protonix daily. She reports compliance with daily po iron.  PAST MEDICAL HISTORY:   Past Medical History: Past Medical History:  Diagnosis Date   Anemia    Arthritis    Calcaneal spur of foot    Carpal tunnel syndrome on both sides    Essential hypertension    Frozen shoulder    Left   Gastroesophageal reflux disease    Hypertension    Insulin resistance    Knee joint pain    Bilateral;  R>L   Left sided sciatica    Lumbar paraspinal muscle spasm    Tendonitis of ankle, left    Thalassemia trait     Surgical History: Past Surgical History:  Procedure Laterality Date   BIOPSY  12/22/2022   Procedure: BIOPSY;  Surgeon: Kerin Salen, MD;  Location: [facility];  Service: Gastroenterology;;   BREAST BIOPSY Right 11/06/2022   MM RT BREAST BX W LOC DEV 1ST LESION IMAGE BX SPEC STEREO GUIDE 11/06/2022 [facility]   BREAST BIOPSY Right 11/06/2022   MM RT BREAST BX W LOC DEV EA AD LESION IMG BX SPEC STEREO GUIDE 11/06/2022 [facility]   COLONOSCOPY WITH PROPOFOL N/A 12/22/2022   Procedure: COLONOSCOPY WITH PROPOFOL;  Surgeon: Kerin Salen, MD;  Location: [facility];  Service: Gastroenterology;  Laterality: N/A;   ESOPHAGOGASTRODUODENOSCOPY (EGD) WITH PROPOFOL N/A 12/22/2022   Procedure: ESOPHAGOGASTRODUODENOSCOPY (EGD) WITH PROPOFOL;  Surgeon: Kerin Salen, MD;  Location: [facility];  Service: Gastroenterology;  Laterality: N/A;   POLYPECTOMY  12/22/2022   Procedure: POLYPECTOMY;  Surgeon: Kerin Salen, MD;  Location: [facility];  Service: Gastroenterology;;  polypectomy using cold forceps   TOTAL ABDOMINAL HYSTERECTOMY      Social History: Social History   Socioeconomic History   Marital status: Married    Spouse name: Not on file   Number of children: Not on file   Years of  education: Not on file   Highest education level: Not on file  Occupational History   Occupation: Engineer, site    Comment: [employer]  Tobacco Use   Smoking status: Never   Smokeless tobacco: Never  Substance and Sexual Activity   Alcohol use: No   Drug use: No   Sexual activity: Yes    Partners: Male    Birth control/protection: Other-see comments    Comment: NO CYCLES; PT HAD HYST  Other Topics Concern   Not on file  Social History Narrative   Not on file   Social Determinants of Health   Financial Resource Strain: Not on file  Food Insecurity: No Food Insecurity (10/15/2021)   Hunger Vital Sign    Worried About Running Out of Food in the Last Year: Never true    Ran Out of Food in the Last Year: Never true  Transportation Needs: Not on file  Physical Activity: Not on file  Stress: Not on file  Social Connections: Not on file  Intimate Partner Violence: Not on file    Family History: Family History  Problem Relation Age of Onset   Hypertension Mother    Osteoarthritis Mother    Anemia Mother    Hypertension Father    Diabetes Father    Breast cancer Sister 32   Anemia Sister    Hypertension Brother    Diabetes Brother    Lymphoma Maternal Grandmother    Emphysema Maternal Grandfather    Heart attack Paternal Grandfather    Colon cancer Other    Hyperlipidemia Other    Stroke Other     Current Medications:  Current Outpatient Medications:    albuterol (PROVENTIL HFA;VENTOLIN HFA) 108 (90 BASE) MCG/ACT inhaler, Inhale 2 puffs into the lungs every 6 (six) hours as needed for wheezing., Disp: 1 Inhaler, Rfl: 2   aspirin EC 81 MG tablet, Take 81 mg by mouth daily. Swallow whole., Disp: , Rfl:    atorvastatin (LIPITOR) 10 MG tablet, Take 10 mg by mouth at bedtime., Disp: , Rfl:    Biotin 5000 MCG TABS, Take 5,000 mcg by mouth daily., Disp: , Rfl:    Cholecalciferol (VITAMIN D) 50 MCG (2000 UT) CAPS, Take 2,000 Units by mouth daily., Disp: ,  Rfl:    cyclobenzaprine (FLEXERIL) 10 MG tablet, Take 10 mg by mouth 3 (three) times daily as needed for muscle spasms., Disp: , Rfl:    docusate sodium (COLACE) 100 MG capsule, Take 100 mg by mouth daily., Disp: , Rfl:    fluticasone (FLONASE) 50 MCG/ACT nasal spray, Place 1 spray into both nostrils daily as needed for allergies., Disp: , Rfl: 1   hydrochlorothiazide (HYDRODIURIL) 12.5 MG tablet, Take 12.5 mg by mouth daily., Disp: , Rfl: 12   ibuprofen (ADVIL,MOTRIN) 800 MG tablet, TAKE 1 TABLET EVERY 8 HOURS AS NEEDED FOR PAIN, Disp: 90 tablet, Rfl: 3   Iron-FA-B Cmp-C-Biot-Probiotic (FUSION PLUS) CAPS, Take 1 capsule by mouth daily., Disp: , Rfl:    LINZESS 290 MCG CAPS capsule, Take 290 mcg by mouth every morning., Disp: , Rfl:    lisinopril (PRINIVIL,ZESTRIL) 10 MG tablet, Take 1 tablet (10 mg total) by mouth 2 (two) times daily., Disp: 30 tablet, Rfl: 0   Magnesium 400 MG TABS, Take 400 mg by mouth daily., Disp: , Rfl:    meloxicam (MOBIC) 7.5 MG tablet, Take 7.5 mg by mouth 2 (two) times daily as needed for pain., Disp: , Rfl:    metFORMIN (GLUCOPHAGE) 500 MG tablet, Take 500 mg by mouth 2 (two) times daily with a meal., Disp: , Rfl:    MOUNJARO 7.5 MG/0.5ML Pen, Inject 7.5 mg into the skin every Wednesday., Disp: , Rfl:    Multiple Vitamins-Minerals (MULTIVITAMIN WITH MINERALS) tablet, Take 1 tablet by mouth daily., Disp: , Rfl:    nitrofurantoin (MACRODANTIN) 50 MG capsule, Take 50 mg by mouth See admin instructions. Take after having intercourse, Disp: , Rfl:    pantoprazole (PROTONIX) 40 MG tablet, Take 40 mg by mouth daily as needed (acid reflux)., Disp: , Rfl:    traMADol (ULTRAM) 50 MG tablet, Take 50 mg by mouth every 6 (six) hours as needed for moderate pain or severe pain., Disp: , Rfl: 0   vitamin E 400 UNIT capsule, Take 400 Units by mouth daily., Disp: , Rfl:    Allergies: Allergies  Allergen Reactions   Peanut-Containing Drug  Products Hives and Itching   Bextra  [Valdecoxib] Hives   Morphine And Related Itching   Other     Pt is allergic to many eye drops.    Penicillins Hives and Swelling    No Ciillins per pt. Has patient had a PCN reaction causing immediate rash, facial/tongue/throat swelling, SOB or lightheadedness with hypotension: No Has patient had a PCN reaction causing severe rash involving mucus membranes or skin necrosis: No Has patient had a PCN reaction that required hospitalization No Has patient had a PCN reaction occurring within the last 10 years: No If all of the above answers are "NO", then may proceed with Cephalosporin use.   Strawberry Flavor Itching   Tobramycin Hives    REVIEW OF SYSTEMS:   Review of Systems  Constitutional:  Positive for appetite change and fatigue. Negative for chills and fever.  HENT:   Negative for lump/mass, mouth sores, nosebleeds, sore throat and trouble swallowing.   Eyes:  Negative for eye problems.  Respiratory:  Negative for cough and shortness of breath.   Cardiovascular:  Positive for chest pain (episode x2 weeks ago). Negative for leg swelling and palpitations.  Gastrointestinal:  Positive for nausea. Negative for abdominal pain, constipation, diarrhea and vomiting.  Genitourinary:  Negative for bladder incontinence, difficulty urinating, dysuria, frequency, hematuria and nocturia.   Musculoskeletal:  Positive for back pain (6/10 in severity). Negative for arthralgias, flank pain, myalgias and neck pain.  Skin:  Negative for itching and rash.  Neurological:  Positive for dizziness (resolved) and numbness (left leg, chronic). Negative for headaches.  Hematological:  Does not bruise/bleed easily.  Psychiatric/Behavioral:  Positive for sleep disturbance. Negative for depression and suicidal ideas. The patient is not nervous/anxious.   All other systems reviewed and are negative.    VITALS:   Blood pressure 133/85, pulse 83, temperature 98.2 F (36.8 C), temperature source Oral, resp.  rate 16, weight (!) 311 lb 4.6 oz (141.2 kg), SpO2 99 %.  Wt Readings from Last 3 Encounters:  02/03/23 (!) 311 lb 4.6 oz (141.2 kg)  12/22/22 (!) 319 lb (144.7 kg)  09/28/22 (!) 338 lb 10 oz (153.6 kg)    Body mass index is 53.43 kg/m.  PHYSICAL EXAM:   Physical Exam Vitals and nursing note reviewed. Exam conducted with a chaperone present.  Constitutional:      Appearance: Normal appearance.  Cardiovascular:     Rate and Rhythm: Normal rate and regular rhythm.     Pulses: Normal pulses.     Heart sounds: Normal heart sounds.  Pulmonary:     Effort: Pulmonary effort is normal.     Breath sounds: Normal breath sounds.  Abdominal:     Palpations: Abdomen is soft. There is no hepatomegaly, splenomegaly or mass.     Tenderness: There is no abdominal tenderness.  Musculoskeletal:     Right lower leg: No edema.     Left lower leg: No edema.  Lymphadenopathy:     Cervical: No cervical adenopathy.     Right cervical: No superficial, deep or posterior cervical adenopathy.    Left cervical: No superficial, deep or posterior cervical adenopathy.     Upper Body:     Right upper body: No supraclavicular or axillary adenopathy.     Left upper body: No supraclavicular or axillary adenopathy.  Neurological:     General: No focal deficit present.     Mental Status: She is alert and oriented to person, place, and time.  Psychiatric:  Mood and Affect: Mood normal.        Behavior: Behavior normal.     LABS:      Latest Ref Rng & Units 02/03/2023   10:10 AM 08/14/2022   11:58 AM 01/28/2016    2:14 PM  CBC  WBC 4.0 - 10.5 K/uL 8.4  8.3  10.6   Hemoglobin 12.0 - 15.0 g/dL 54.0  9.8  9.7   Hematocrit 36.0 - 46.0 % 36.0  32.7  32.8   Platelets 150 - 400 K/uL 235  252  253       Latest Ref Rng & Units 08/14/2022   11:58 AM 01/12/2018   11:22 AM 01/28/2016    2:14 PM  CMP  Glucose 70 - 99 mg/dL 981   191   BUN 6 - 20 mg/dL 13   12   Creatinine 4.78 - 1.00 mg/dL 2.95   6.21    Sodium 308 - 145 mmol/L 138   138   Potassium 3.5 - 5.1 mmol/L 3.8   4.2   Chloride 98 - 111 mmol/L 105   102   CO2 22 - 32 mmol/L 26   24   Calcium 8.9 - 10.3 mg/dL 8.9   9.2   Total Protein 6.5 - 8.1 g/dL 6.9  7.1    Total Bilirubin 0.3 - 1.2 mg/dL 0.4     Alkaline Phos 38 - 126 U/L 57     AST 15 - 41 U/L 17     ALT 0 - 44 U/L 18      Component     Latest Ref Rng 08/14/2022  Kappa free light chain     3.3 - 19.4 mg/L 36.7 (H)   Lambda free light chains     5.7 - 26.3 mg/L 18.5   Kappa, lambda light chain ratio     0.26 - 1.65  1.98 (H)    No results found for: "CEA1", "CEA" / No results found for: "CEA1", "CEA" No results found for: "PSA1" No results found for: "MVH846" No results found for: "CAN125"  Lab Results  Component Value Date   TOTALPROTELP 6.4 08/14/2022   ALBUMINELP 3.4 08/14/2022   A1GS 0.2 08/14/2022   A2GS 0.6 08/14/2022   BETS 1.1 08/14/2022   GAMS 1.1 08/14/2022   MSPIKE Not Observed 08/14/2022   SPEI Comment 08/14/2022   Lab Results  Component Value Date   TIBC 299 02/03/2023   TIBC 342 08/14/2022   FERRITIN 114 02/03/2023   FERRITIN 26 08/14/2022   IRONPCTSAT 20 02/03/2023   IRONPCTSAT 14 08/14/2022   Lab Results  Component Value Date   LDH 145 08/14/2022   Pathology 12/22/22: INAL MICROSCOPIC DIAGNOSIS:   A. DUODENUM, BIOPSY:  - Duodenal mucosa with no specific histopathologic changes  - Negative for increased intraepithelial lymphocytes or villous  architectural changes   B. STOMACH, ANTRUM AND BODY, BIOPSY:  - Gastric antral and oxyntic mucosa with chronic gastritis, see comment   C. COLON, TRANSVERSE, POLYPECTOMY:  - Tubular adenoma(s)  - Negative for high-grade dysplasia or malignancy   D. COLON, DESCENDING, POLYPECTOMY:  - Hyperplastic polyp(s)   STUDIES:   No results found.

## 2023-02-03 NOTE — Patient Instructions (Addendum)
[facility] at [facility] Discharge Instructions   You were seen and examined today by Dr. Ellin Saba.  He reviewed the results of your lab work. Your hemoglobin is up to 11.0. Your ferritin is 114. Your white blood cell count and platelet count are normal.   We will see you back in 6 months. We will repeat lab work prior to this visit.    Thank you for choosing [facility] at [facility] to provide your oncology and hematology care.  To afford each patient quality time with our provider, please arrive at least 15 minutes before your scheduled appointment time.   If you have a lab appointment with the Cancer Center please come in thru the Main Entrance and check in at the main information desk.  You need to re-schedule your appointment should you arrive 10 or more minutes late.  We strive to give you quality time with our providers, and arriving late affects you and other patients whose appointments are after yours.  Also, if you no show three or more times for appointments you may be dismissed from the clinic at the providers discretion.     Again, thank you for choosing [facility].  Our hope is that these requests will decrease the amount of time that you wait before being seen by our physicians.       _____________________________________________________________  Should you have questions after your visit to [facility], please contact our office at [phone] and follow the prompts.  Our office hours are 8:00 a.m. and 4:30 p.m. Monday - Friday.  Please note that voicemails left after 4:00 p.m. may not be returned until the following business day.  We are closed weekends and major holidays.  You do have access to a nurse 24-7, just call the main number to the clinic [phone] and do not press any options, hold on the line and a nurse will answer the phone.    For prescription refill requests, have your pharmacy  contact our office and allow 72 hours.    Due to Covid, you will need to wear a mask upon entering the hospital. If you do not have a mask, a mask will be given to you at the Main Entrance upon arrival. For doctor visits, patients may have 1 support person age 29 or older with them. For treatment visits, patients can not have anyone with them due to social distancing guidelines and our immunocompromised population.

## 2023-05-24 ENCOUNTER — Encounter: Payer: Self-pay | Admitting: Family Medicine

## 2023-07-28 ENCOUNTER — Inpatient Hospital Stay: Payer: Managed Care, Other (non HMO) | Attending: Hematology

## 2023-07-28 DIAGNOSIS — D72820 Lymphocytosis (symptomatic): Secondary | ICD-10-CM | POA: Diagnosis not present

## 2023-07-28 DIAGNOSIS — R768 Other specified abnormal immunological findings in serum: Secondary | ICD-10-CM | POA: Diagnosis not present

## 2023-07-28 DIAGNOSIS — D509 Iron deficiency anemia, unspecified: Secondary | ICD-10-CM | POA: Insufficient documentation

## 2023-07-28 DIAGNOSIS — D508 Other iron deficiency anemias: Secondary | ICD-10-CM

## 2023-07-28 LAB — IRON AND TIBC
Iron: 47 ug/dL (ref 28–170)
Saturation Ratios: 16 % (ref 10.4–31.8)
TIBC: 293 ug/dL (ref 250–450)
UIBC: 246 ug/dL

## 2023-07-28 LAB — CBC WITH DIFFERENTIAL/PLATELET
Abs Immature Granulocytes: 0.05 10*3/uL (ref 0.00–0.07)
Basophils Absolute: 0 10*3/uL (ref 0.0–0.1)
Basophils Relative: 0 %
Eosinophils Absolute: 0.1 10*3/uL (ref 0.0–0.5)
Eosinophils Relative: 1 %
HCT: 36.1 % (ref 36.0–46.0)
Hemoglobin: 10.7 g/dL — ABNORMAL LOW (ref 12.0–15.0)
Immature Granulocytes: 1 %
Lymphocytes Relative: 28 %
Lymphs Abs: 3 10*3/uL (ref 0.7–4.0)
MCH: 20.9 pg — ABNORMAL LOW (ref 26.0–34.0)
MCHC: 29.6 g/dL — ABNORMAL LOW (ref 30.0–36.0)
MCV: 70.6 fL — ABNORMAL LOW (ref 80.0–100.0)
Monocytes Absolute: 0.7 10*3/uL (ref 0.1–1.0)
Monocytes Relative: 7 %
Neutro Abs: 6.9 10*3/uL (ref 1.7–7.7)
Neutrophils Relative %: 63 %
Platelets: 321 10*3/uL (ref 150–400)
RBC: 5.11 MIL/uL (ref 3.87–5.11)
RDW: 15.2 % (ref 11.5–15.5)
WBC: 10.8 10*3/uL — ABNORMAL HIGH (ref 4.0–10.5)
nRBC: 0 % (ref 0.0–0.2)

## 2023-07-28 LAB — COMPREHENSIVE METABOLIC PANEL
ALT: 22 U/L (ref 0–44)
AST: 17 U/L (ref 15–41)
Albumin: 3.4 g/dL — ABNORMAL LOW (ref 3.5–5.0)
Alkaline Phosphatase: 75 U/L (ref 38–126)
Anion gap: 8 (ref 5–15)
BUN: 11 mg/dL (ref 6–20)
CO2: 27 mmol/L (ref 22–32)
Calcium: 8.9 mg/dL (ref 8.9–10.3)
Chloride: 100 mmol/L (ref 98–111)
Creatinine, Ser: 0.95 mg/dL (ref 0.44–1.00)
GFR, Estimated: >60 mL/min (ref >60)
Glucose, Bld: 129 mg/dL — ABNORMAL HIGH (ref 70–99)
Potassium: 3.6 mmol/L (ref 3.5–5.1)
Sodium: 135 mmol/L (ref 135–145)
Total Bilirubin: 0.6 mg/dL (ref 0.3–1.2)
Total Protein: 6.7 g/dL (ref 6.5–8.1)

## 2023-07-28 LAB — FERRITIN: Ferritin: 120 ng/mL (ref 11–307)

## 2023-07-29 LAB — KAPPA/LAMBDA LIGHT CHAINS
Kappa free light chain: 38.3 mg/L — ABNORMAL HIGH (ref 3.3–19.4)
Kappa, lambda light chain ratio: 1.61 (ref 0.26–1.65)
Lambda free light chains: 23.8 mg/L (ref 5.7–26.3)

## 2023-08-01 LAB — PROTEIN ELECTROPHORESIS, SERUM
A/G Ratio: 1.1 (ref 0.7–1.7)
Albumin ELP: 3.2 g/dL (ref 2.9–4.4)
Alpha-1-Globulin: 0.2 g/dL (ref 0.0–0.4)
Alpha-2-Globulin: 0.7 g/dL (ref 0.4–1.0)
Beta Globulin: 1.2 g/dL (ref 0.7–1.3)
Gamma Globulin: 0.8 g/dL (ref 0.4–1.8)
Globulin, Total: 3 g/dL (ref 2.2–3.9)
Total Protein ELP: 6.2 g/dL (ref 6.0–8.5)

## 2023-08-03 LAB — IMMUNOFIXATION ELECTROPHORESIS
IgA: 456 mg/dL — ABNORMAL HIGH (ref 87–352)
IgG (Immunoglobin G), Serum: 985 mg/dL (ref 586–1602)
IgM (Immunoglobulin M), Srm: 104 mg/dL (ref 26–217)
Total Protein ELP: 6.2 g/dL (ref 6.0–8.5)

## 2023-08-03 NOTE — Progress Notes (Signed)
Allison [address] [place] [address]    Clinic Day:  08/04/2023  Referring physician: Irven Coe, MD  Patient Care Team: Irven Coe, MD as PCP - General (Family Medicine) Doreatha Massed, MD as Medical Oncologist (Hematology)   ASSESSMENT & PLAN:   Assessment: 1.  Microcytic anemia: - Patient seen at the request of Celso Amy, PA at [facility] gastroenterology. - CBC (06/10/2022): Hb-9.9, MCV-66, ALC-3000 (1100-2700), ferritin-31, percent saturation-17 - CBC (04/13/2022): Hb-10.4, MCV-67, ferritin-25, percent saturation-13 - CBC (01/29/2021) Hb-10.3, ALC increased at 3.6. - Last colonoscopy in December 2012, normal. - CTAP (07/08/2022): Fatty liver.  Spleen size normal. - No prior history of transfusion/parenteral iron.  Ice pica present. - BMBX in [place] normal several years ago.   2.  Social/family history: - Lives at home with her husband Allison Henderson.  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  She is a Lawyer.  Non-smoker. - Niece has sickle cell trait.  Father had chronic leukemia.  Sister had breast cancer at age 13.  Maternal grandmother had lymphoma.    Plan: 1.  Microcytic anemia: - She continues to take iron tablet in the form of fusion plus once daily.  She is on Protonix which potentially decreases oral iron absorption. - She denies any bleeding per rectum or vagina. - Today her hemoglobin is 10.7 with MCV of 70.6.  Ferritin is 120 and percent saturation is 16.  She is feeling tired. - Will give her 1 dose of Venofer 400 mg IV. - Recommend follow-up in 6 months with repeat CBC, ferritin, iron panel.   2.  Elevated kappa light chains: - We reviewed myeloma labs from 07/28/2023.  SPEP is negative for M spike.  Immunofixation shows polyclonal increase in immunoglobulins.  Kappa light chains are elevated at 38.3, lambda light chains 23.8 and ratio was normal at 1.61.  No clear evidence of plasma cell  disorder at this time.  Will repeat free light chains in 6 months.   3.  Lymphocytosis: - She had lymphocytosis previously for which flow cytometry was done which did not show any aberrant B or T-cell population.  She has mildly elevated white count of 10.8 with normal differential today.    Orders Placed This Encounter  Procedures   CBC with Differential    Standing Status:   Future    Standing Expiration Date:   08/03/2024   Kappa/lambda light chains    Standing Status:   Future    Standing Expiration Date:   08/03/2024   Ferritin    Standing Status:   Future    Standing Expiration Date:   08/03/2024   Iron and TIBC ([facility] ONLY)    Standing Status:   Future    Standing Expiration Date:   08/03/2024      I,Katie Daubenspeck,acting as a scribe for Doreatha Massed, MD.,have documented all relevant documentation on the behalf of Doreatha Massed, MD,as directed by  Doreatha Massed, MD while in the presence of Doreatha Massed, MD.   I, Doreatha Massed MD, have reviewed the above documentation for accuracy and completeness, and I agree with the above.   Doreatha Massed, MD   10/16/202411:53 AM  CHIEF COMPLAINT:   Diagnosis: microcytic anemia    Cancer Staging  No matching staging information was found for the patient.    Prior Therapy: Venofer   Current Therapy:  oral iron supplement    HISTORY OF PRESENT ILLNESS:  Oncology History   No history exists.     INTERVAL HISTORY:   Allison Henderson is a 62 y.o. female presenting to clinic today for follow up of microcytic anemia. She was last seen by me on 02/03/23.  Today, she states that she is doing well overall. Her appetite level is at 75%. Her energy level is at 40%.  PAST MEDICAL HISTORY:   Past Medical History: Past Medical History:  Diagnosis Date   Anemia    Arthritis    Calcaneal spur of foot    Carpal tunnel syndrome on both sides    Essential hypertension     Frozen shoulder    Left   Gastroesophageal reflux disease    Hypertension    Insulin resistance    Knee joint pain    Bilateral;  R>L   Left sided sciatica    Lumbar paraspinal muscle spasm    Tendonitis of ankle, left    Thalassemia trait     Surgical History: Past Surgical History:  Procedure Laterality Date   BIOPSY  12/22/2022   Procedure: BIOPSY;  Surgeon: Kerin Salen, MD;  Location: [facility];  Service: Gastroenterology;;   BREAST BIOPSY Right 11/06/2022   MM RT BREAST BX W LOC DEV 1ST LESION IMAGE BX SPEC STEREO GUIDE 11/06/2022 [facility]   BREAST BIOPSY Right 11/06/2022   MM RT BREAST BX W LOC DEV EA AD LESION IMG BX SPEC STEREO GUIDE 11/06/2022 [facility]   COLONOSCOPY WITH PROPOFOL N/A 12/22/2022   Procedure: COLONOSCOPY WITH PROPOFOL;  Surgeon: Kerin Salen, MD;  Location: [facility];  Service: Gastroenterology;  Laterality: N/A;   ESOPHAGOGASTRODUODENOSCOPY (EGD) WITH PROPOFOL N/A 12/22/2022   Procedure: ESOPHAGOGASTRODUODENOSCOPY (EGD) WITH PROPOFOL;  Surgeon: Kerin Salen, MD;  Location: [facility];  Service: Gastroenterology;  Laterality: N/A;   POLYPECTOMY  12/22/2022   Procedure: POLYPECTOMY;  Surgeon: Kerin Salen, MD;  Location: [facility];  Service: Gastroenterology;;  polypectomy using cold forceps   TOTAL ABDOMINAL HYSTERECTOMY      Social History: Social History   Socioeconomic History   Marital status: Married    Spouse name: Not on file   Number of children: Not on file   Years of education: Not on file   Highest education level: Not on file  Occupational History   Occupation: Engineer, site    Comment: [employer]  Tobacco Use   Smoking status: Never   Smokeless tobacco: Never  Substance and Sexual Activity   Alcohol use: No   Drug use: No   Sexual activity: Yes    Partners: Male    Birth control/protection: Other-see comments    Comment: NO CYCLES; PT HAD HYST  Other Topics Concern   Not on file  Social  History Narrative   Not on file   Social Determinants of Health   Financial Resource Strain: Not on file  Food Insecurity: No Food Insecurity (10/15/2021)   Hunger Vital Sign    Worried About Running Out of Food in the Last Year: Never true    Ran Out of Food in the Last Year: Never true  Transportation Needs: Not on file  Physical Activity: Not on file  Stress: Not on file  Social Connections: Not on file  Intimate Partner Violence: Not on file    Family History: Family History  Problem Relation Age of Onset   Hypertension Mother    Osteoarthritis Mother    Anemia Mother    Hypertension Father    Diabetes Father  Breast cancer Sister 45   Anemia Sister    Hypertension Brother    Diabetes Brother    Lymphoma Maternal Grandmother    Emphysema Maternal Grandfather    Heart attack Paternal Grandfather    Colon cancer Other    Hyperlipidemia Other    Stroke Other     Current Medications:  Current Outpatient Medications:    ACCU-CHEK GUIDE test strip, USE TO CHECK FASTING BLOOD SUGAR EVERY MORNING 90, Disp: , Rfl:    albuterol (PROVENTIL HFA;VENTOLIN HFA) 108 (90 BASE) MCG/ACT inhaler, Inhale 2 puffs into the lungs every 6 (six) hours as needed for wheezing., Disp: 1 Inhaler, Rfl: 2   aspirin EC 81 MG tablet, Take 81 mg by mouth daily. Swallow whole., Disp: , Rfl:    atorvastatin (LIPITOR) 10 MG tablet, Take 10 mg by mouth at bedtime., Disp: , Rfl:    Biotin 5000 MCG TABS, Take 5,000 mcg by mouth daily., Disp: , Rfl:    Cholecalciferol (VITAMIN D) 50 MCG (2000 UT) CAPS, Take 2,000 Units by mouth daily., Disp: , Rfl:    cyclobenzaprine (FLEXERIL) 10 MG tablet, Take 10 mg by mouth 3 (three) times daily as needed for muscle spasms., Disp: , Rfl:    docusate sodium (COLACE) 100 MG capsule, Take 100 mg by mouth daily., Disp: , Rfl:    fluticasone (FLONASE) 50 MCG/ACT nasal spray, Place 1 spray into both nostrils daily as needed for allergies., Disp: , Rfl: 1    hydrochlorothiazide (HYDRODIURIL) 12.5 MG tablet, Take 12.5 mg by mouth daily., Disp: , Rfl: 12   ibuprofen (ADVIL,MOTRIN) 800 MG tablet, TAKE 1 TABLET EVERY 8 HOURS AS NEEDED FOR PAIN, Disp: 90 tablet, Rfl: 3   Iron-FA-B Cmp-C-Biot-Probiotic (FUSION PLUS) CAPS, Take 1 capsule by mouth daily., Disp: , Rfl:    LINZESS 290 MCG CAPS capsule, Take 290 mcg by mouth every morning., Disp: , Rfl:    lisinopril (PRINIVIL,ZESTRIL) 10 MG tablet, Take 1 tablet (10 mg total) by mouth 2 (two) times daily., Disp: 30 tablet, Rfl: 0   Magnesium 400 MG TABS, Take 400 mg by mouth daily., Disp: , Rfl:    meloxicam (MOBIC) 7.5 MG tablet, Take 7.5 mg by mouth 2 (two) times daily as needed for pain., Disp: , Rfl:    metFORMIN (GLUCOPHAGE) 500 MG tablet, Take 500 mg by mouth daily with breakfast., Disp: , Rfl:    MOUNJARO 7.5 MG/0.5ML Pen, Inject 12.5 mg into the skin every Wednesday., Disp: , Rfl:    Multiple Vitamins-Minerals (MULTIVITAMIN WITH MINERALS) tablet, Take 1 tablet by mouth daily., Disp: , Rfl:    nitrofurantoin (MACRODANTIN) 50 MG capsule, Take 50 mg by mouth See admin instructions. Take after having intercourse, Disp: , Rfl:    pantoprazole (PROTONIX) 40 MG tablet, Take 40 mg by mouth daily as needed (acid reflux)., Disp: , Rfl:    traMADol (ULTRAM) 50 MG tablet, Take 50 mg by mouth every 6 (six) hours as needed for moderate pain or severe pain., Disp: , Rfl: 0   vitamin E 400 UNIT capsule, Take 400 Units by mouth daily., Disp: , Rfl:    famotidine (PEPCID) 20 MG tablet, TAKE 2 TABLETS BY MOUTH DAILY AS DIRECTED, Disp: , Rfl:    HYDROcodone-acetaminophen (NORCO) 10-325 MG tablet, TAKE 1 TABLET 3 TIMES A DAY BY ORAL ROUTE AS NEEDED., Disp: , Rfl:    metFORMIN (GLUCOPHAGE-XR) 500 MG 24 hr tablet, TAKE 1 TABLET WITH EVENING MEAL EVERY DAY BY MOUTH, Disp: , Rfl:  MOUNJARO 12.5 MG/0.5ML Pen, INJECT 12.5 MG SUBCUTANEOUSLY ONCE A WEEK, Disp: , Rfl:  No current facility-administered medications for this  visit.  Facility-Administered Medications Ordered in Other Visits:    0.9 %  sodium chloride infusion, , Intravenous, Continuous, Doreatha Massed, MD, Last Rate: 10 mL/hr at 08/04/23 0910, New Bag at 08/04/23 0910   iron sucrose (VENOFER) 400 mg in sodium chloride 0.9 % 250 mL IVPB, 400 mg, Intravenous, Once, Doreatha Massed, MD, Last Rate: 108 mL/hr at 08/04/23 1020, 400 mg at 08/04/23 1020   Allergies: Allergies  Allergen Reactions   Peanut-Containing Drug Products Hives and Itching   Bextra [Valdecoxib] Hives   Morphine And Codeine Itching   Other     Pt is allergic to many eye drops.    Penicillins Hives and Swelling    No Ciillins per pt. Has patient had a PCN reaction causing immediate rash, facial/tongue/throat swelling, SOB or lightheadedness with hypotension: No Has patient had a PCN reaction causing severe rash involving mucus membranes or skin necrosis: No Has patient had a PCN reaction that required hospitalization No Has patient had a PCN reaction occurring within the last 10 years: No If all of the above answers are "NO", then may proceed with Cephalosporin use.   Strawberry Flavor Itching   Tobramycin Hives    REVIEW OF SYSTEMS:   Review of Systems  Constitutional:  Positive for fatigue. Negative for chills and fever.  HENT:   Negative for lump/mass, mouth sores, nosebleeds, sore throat and trouble swallowing.   Eyes:  Negative for eye problems.  Respiratory:  Negative for cough and shortness of breath.   Cardiovascular:  Negative for chest pain, leg swelling and palpitations.  Gastrointestinal:  Positive for constipation. Negative for abdominal pain, diarrhea, nausea and vomiting.  Genitourinary:  Negative for bladder incontinence, difficulty urinating, dysuria, frequency, hematuria and nocturia.   Musculoskeletal:  Negative for arthralgias, back pain, flank pain, myalgias and neck pain.  Skin:  Negative for itching and rash.  Neurological:  Positive  for numbness. Negative for dizziness and headaches.  Hematological:  Does not bruise/bleed easily.  Psychiatric/Behavioral:  Positive for sleep disturbance. Negative for depression and suicidal ideas. The patient is nervous/anxious.   All other systems reviewed and are negative.    VITALS:   There were no vitals taken for this visit.  Wt Readings from Last 3 Encounters:  08/04/23 282 lb 12.8 oz (128.3 kg)  02/03/23 (!) 311 lb 4.6 oz (141.2 kg)  12/22/22 (!) 319 lb (144.7 kg)    There is no height or weight on file to calculate BMI.  Performance status (ECOG): 0 - Asymptomatic  PHYSICAL EXAM:   Physical Exam Vitals and nursing note reviewed. Exam conducted with a chaperone present.  Constitutional:      Appearance: Normal appearance.  Cardiovascular:     Rate and Rhythm: Normal rate and regular rhythm.     Pulses: Normal pulses.     Heart sounds: Normal heart sounds.  Pulmonary:     Effort: Pulmonary effort is normal.     Breath sounds: Normal breath sounds.  Abdominal:     Palpations: Abdomen is soft. There is no hepatomegaly, splenomegaly or mass.     Tenderness: There is no abdominal tenderness.  Musculoskeletal:     Right lower leg: No edema.     Left lower leg: No edema.  Lymphadenopathy:     Cervical: No cervical adenopathy.     Right cervical: No superficial, deep or posterior cervical  adenopathy.    Left cervical: No superficial, deep or posterior cervical adenopathy.     Upper Body:     Right upper body: No supraclavicular or axillary adenopathy.     Left upper body: No supraclavicular or axillary adenopathy.  Neurological:     General: No focal deficit present.     Mental Status: She is alert and oriented to person, place, and time.  Psychiatric:        Mood and Affect: Mood normal.        Behavior: Behavior normal.     LABS:      Latest Ref Rng & Units 07/28/2023   11:13 AM 02/03/2023   10:10 AM 08/14/2022   11:58 AM  CBC  WBC 4.0 - 10.5 K/uL 10.8   8.4  8.3   Hemoglobin 12.0 - 15.0 g/dL 09.8  11.9  9.8   Hematocrit 36.0 - 46.0 % 36.1  36.0  32.7   Platelets 150 - 400 K/uL 321  235  252       Latest Ref Rng & Units 07/28/2023   11:13 AM 08/14/2022   11:58 AM 01/12/2018   11:22 AM  CMP  Glucose 70 - 99 mg/dL 147  829    BUN 6 - 20 mg/dL 11  13    Creatinine 5.62 - 1.00 mg/dL 1.30  8.65    Sodium 784 - 145 mmol/L 135  138    Potassium 3.5 - 5.1 mmol/L 3.6  3.8    Chloride 98 - 111 mmol/L 100  105    CO2 22 - 32 mmol/L 27  26    Calcium 8.9 - 10.3 mg/dL 8.9  8.9    Total Protein 6.5 - 8.1 g/dL 6.7  6.9  7.1   Total Bilirubin 0.3 - 1.2 mg/dL 0.6  0.4    Alkaline Phos 38 - 126 U/L 75  57    AST 15 - 41 U/L 17  17    ALT 0 - 44 U/L 22  18       No results found for: "CEA1", "CEA" / No results found for: "CEA1", "CEA" No results found for: "PSA1" No results found for: "CAN199" No results found for: "CAN125"  Lab Results  Component Value Date   TOTALPROTELP 6.2 07/28/2023   TOTALPROTELP 6.2 07/28/2023   ALBUMINELP 3.2 07/28/2023   A1GS 0.2 07/28/2023   A2GS 0.7 07/28/2023   BETS 1.2 07/28/2023   GAMS 0.8 07/28/2023   MSPIKE Not Observed 07/28/2023   SPEI Comment 07/28/2023   Lab Results  Component Value Date   TIBC 293 07/28/2023   TIBC 299 02/03/2023   TIBC 342 08/14/2022   FERRITIN 120 07/28/2023   FERRITIN 114 02/03/2023   FERRITIN 26 08/14/2022   IRONPCTSAT 16 07/28/2023   IRONPCTSAT 20 02/03/2023   IRONPCTSAT 14 08/14/2022   Lab Results  Component Value Date   LDH 145 08/14/2022     STUDIES:   No results found.

## 2023-08-04 ENCOUNTER — Inpatient Hospital Stay (HOSPITAL_BASED_OUTPATIENT_CLINIC_OR_DEPARTMENT_OTHER): Payer: Managed Care, Other (non HMO) | Admitting: Hematology

## 2023-08-04 ENCOUNTER — Ambulatory Visit: Payer: Managed Care, Other (non HMO) | Admitting: Physician Assistant

## 2023-08-04 ENCOUNTER — Inpatient Hospital Stay: Payer: Managed Care, Other (non HMO)

## 2023-08-04 VITALS — BP 115/57 | HR 78 | Temp 97.9°F | Resp 18 | Ht 65.0 in | Wt 282.8 lb

## 2023-08-04 DIAGNOSIS — D508 Other iron deficiency anemias: Secondary | ICD-10-CM | POA: Diagnosis not present

## 2023-08-04 DIAGNOSIS — D509 Iron deficiency anemia, unspecified: Secondary | ICD-10-CM | POA: Diagnosis not present

## 2023-08-04 MED ORDER — FAMOTIDINE IN NACL 20-0.9 MG/50ML-% IV SOLN
20.0000 mg | Freq: Once | INTRAVENOUS | Status: AC
  Administered 2023-08-04: 20 mg via INTRAVENOUS
  Filled 2023-08-04: qty 50

## 2023-08-04 MED ORDER — SODIUM CHLORIDE 0.9 % IV SOLN: INTRAVENOUS | Status: DC

## 2023-08-04 MED ORDER — METHYLPREDNISOLONE SODIUM SUCC 125 MG IJ SOLR
125.0000 mg | Freq: Once | INTRAMUSCULAR | Status: AC
  Administered 2023-08-04: 125 mg via INTRAVENOUS
  Filled 2023-08-04: qty 2

## 2023-08-04 MED ORDER — DIPHENHYDRAMINE HCL 50 MG/ML IJ SOLN
50.0000 mg | Freq: Once | INTRAMUSCULAR | Status: AC
  Administered 2023-08-04: 50 mg via INTRAVENOUS
  Filled 2023-08-04: qty 1

## 2023-08-04 MED ORDER — CYANOCOBALAMIN 1000 MCG/ML IJ SOLN
1000.0000 ug | Freq: Once | INTRAMUSCULAR | Status: AC
  Administered 2023-08-04: 1000 ug via INTRAMUSCULAR
  Filled 2023-08-04: qty 1

## 2023-08-04 MED ORDER — SODIUM CHLORIDE 0.9 % IV SOLN
400.0000 mg | Freq: Once | INTRAVENOUS | Status: AC
  Administered 2023-08-04: 400 mg via INTRAVENOUS
  Filled 2023-08-04: qty 20

## 2023-08-04 NOTE — Patient Instructions (Addendum)
[facility]  Discharge Instructions  You were seen and examined today by Dr. Ellin Saba.  Your labs are stable. You will receive the iron today because of your tiredness.  Follow-up as scheduled.  Thank you for choosing [facility] to provide your oncology and hematology care.   To afford each patient quality time with our provider, please arrive at least 15 minutes before your scheduled appointment time. You may need to reschedule your appointment if you arrive late (10 or more minutes). Arriving late affects you and other patients whose appointments are after yours.  Also, if you miss three or more appointments without notifying the office, you may be dismissed from the clinic at the provider's discretion.    Again, thank you for choosing [facility].  Our hope is that these requests will decrease the amount of time that you wait before being seen by our physicians.   If you have a lab appointment with the Cancer Center - please note that after April 8th, all labs will be drawn in the cancer center.  You do not have to check in or register with the main entrance as you have in the past but will complete your check-in at the cancer center.            _____________________________________________________________  Should you have questions after your visit to [facility], please contact our office at [phone] and follow the prompts.  Our office hours are 8:00 a.m. to 4:30 p.m. Monday - Thursday and 8:00 a.m. to 2:30 p.m. Friday.  Please note that voicemails left after 4:00 p.m. may not be returned until the following business day.  We are closed weekends and all major holidays.  You do have access to a nurse 24-7, just call the main number to the clinic [phone] and do not press any options, hold on the line and a nurse will answer the phone.    For prescription refill requests, have your pharmacy contact  our office and allow 72 hours.    Masks are no longer required in the cancer centers. If you would like for your care team to wear a mask while they are taking care of you, please let them know. You may have one support person who is at least 61 years old accompany you for your appointments.

## 2023-08-04 NOTE — Patient Instructions (Signed)
[facility]  Discharge Instructions: Thank you for choosing [facility] to provide your oncology and hematology care.  If you have a lab appointment with the Cancer Center - please note that after April 8th, 2024, all labs will be drawn in the cancer center.  You do not have to check in or register with the main entrance as you have in the past but will complete your check-in in the cancer center.  Wear comfortable clothing and clothing appropriate for easy access to any Portacath or PICC line.   We strive to give you quality time with your provider. You may need to reschedule your appointment if you arrive late (15 or more minutes).  Arriving late affects you and other patients whose appointments are after yours.  Also, if you miss three or more appointments without notifying the office, you may be dismissed from the clinic at the provider's discretion.      For prescription refill requests, have your pharmacy contact our office and allow 72 hours for refills to be complete     To help prevent nausea and vomiting after your treatment, we encourage you to take your nausea medication as directed.  BELOW ARE SYMPTOMS THAT SHOULD BE REPORTED IMMEDIATELY: *FEVER GREATER THAN 100.4 F (38 C) OR HIGHER *CHILLS OR SWEATING *NAUSEA AND VOMITING THAT IS NOT CONTROLLED WITH YOUR NAUSEA MEDICATION *UNUSUAL SHORTNESS OF BREATH *UNUSUAL BRUISING OR BLEEDING *URINARY PROBLEMS (pain or burning when urinating, or frequent urination) *BOWEL PROBLEMS (unusual diarrhea, constipation, pain near the anus) TENDERNESS IN MOUTH AND THROAT WITH OR WITHOUT PRESENCE OF ULCERS (sore throat, sores in mouth, or a toothache) UNUSUAL RASH, SWELLING OR PAIN  UNUSUAL VAGINAL DISCHARGE OR ITCHING   Items with * indicate a potential emergency and should be followed up as soon as possible or go to the Emergency Department if any problems should occur.  Please show the CHEMOTHERAPY ALERT CARD  or IMMUNOTHERAPY ALERT CARD at check-in to the Emergency Department and triage nurse.  Should you have questions after your visit or need to cancel or reschedule your appointment, please contact [facility] [phone]  and follow the prompts.  Office hours are 8:00 a.m. to 4:30 p.m. Monday - Friday. Please note that voicemails left after 4:00 p.m. may not be returned until the following business day.  We are closed weekends and major holidays. You have access to a nurse at all times for urgent questions. Please call the main number to the clinic [phone] and follow the prompts.  For any non-urgent questions, you may also contact your provider using MyChart. We now offer e-Visits for anyone 29 and older to request care online for non-urgent symptoms. For details visit [email or web address].   Also download the MyChart app! Go to the app store, search "MyChart", open the app, select [facility], and log in with your MyChart username and password.

## 2023-08-04 NOTE — Progress Notes (Signed)
Vitamin B12 Im once today verbal order Dr. Ellin Saba.   Patient tolerated iron infusion with no complaints voiced.  Peripheral IV site clean and dry with good blood return noted before and after infusion.  Band aid applied.  VSS with discharge and left in satisfactory condition with no s/s of distress noted.

## 2023-08-11 ENCOUNTER — Ambulatory Visit: Payer: Managed Care, Other (non HMO) | Admitting: Physician Assistant

## 2023-08-12 ENCOUNTER — Telehealth: Payer: Self-pay | Admitting: *Deleted

## 2023-08-12 ENCOUNTER — Other Ambulatory Visit: Payer: Self-pay | Admitting: *Deleted

## 2023-08-12 DIAGNOSIS — D508 Other iron deficiency anemias: Secondary | ICD-10-CM

## 2023-08-12 NOTE — Telephone Encounter (Signed)
Called to clarify her B 12 levels. The level in July was 131 and she was not on therapy at that time. She had a B 12 injection here on 10/16. She has been on PO supplement for 2 months as well. She had a recheck with PCP yesterday and was the first value since July and was 731.  Per Dr. Ellin Saba, will continue PO therapy and recheck level at next visit.

## 2023-08-16 ENCOUNTER — Other Ambulatory Visit: Payer: Self-pay | Admitting: *Deleted

## 2023-10-25 ENCOUNTER — Other Ambulatory Visit: Payer: Self-pay | Admitting: Obstetrics and Gynecology

## 2023-10-25 DIAGNOSIS — Z1231 Encounter for screening mammogram for malignant neoplasm of breast: Secondary | ICD-10-CM

## 2023-11-24 ENCOUNTER — Ambulatory Visit: Payer: Managed Care, Other (non HMO)

## 2023-12-01 ENCOUNTER — Ambulatory Visit: Payer: Managed Care, Other (non HMO)

## 2023-12-03 ENCOUNTER — Ambulatory Visit
Admission: RE | Admit: 2023-12-03 | Discharge: 2023-12-03 | Disposition: A | Discharge status: Home / Self Care | Payer: Managed Care, Other (non HMO) | Source: Ambulatory Visit | Attending: Obstetrics and Gynecology | Admitting: Obstetrics and Gynecology

## 2023-12-03 ENCOUNTER — Ambulatory Visit: Payer: Managed Care, Other (non HMO)

## 2023-12-03 DIAGNOSIS — Z1231 Encounter for screening mammogram for malignant neoplasm of breast: Secondary | ICD-10-CM

## 2024-01-26 ENCOUNTER — Other Ambulatory Visit: Payer: Managed Care, Other (non HMO)

## 2024-02-02 ENCOUNTER — Ambulatory Visit: Payer: Managed Care, Other (non HMO) | Admitting: Physician Assistant

## 2024-02-02 ENCOUNTER — Inpatient Hospital Stay: Payer: Managed Care, Other (non HMO) | Attending: Hematology

## 2024-02-02 DIAGNOSIS — Z807 Family history of other malignant neoplasms of lymphoid, hematopoietic and related tissues: Secondary | ICD-10-CM | POA: Insufficient documentation

## 2024-02-02 DIAGNOSIS — Z806 Family history of leukemia: Secondary | ICD-10-CM | POA: Insufficient documentation

## 2024-02-02 DIAGNOSIS — K760 Fatty (change of) liver, not elsewhere classified: Secondary | ICD-10-CM | POA: Diagnosis not present

## 2024-02-02 DIAGNOSIS — Z803 Family history of malignant neoplasm of breast: Secondary | ICD-10-CM | POA: Diagnosis not present

## 2024-02-02 DIAGNOSIS — D72820 Lymphocytosis (symptomatic): Secondary | ICD-10-CM | POA: Insufficient documentation

## 2024-02-02 DIAGNOSIS — R768 Other specified abnormal immunological findings in serum: Secondary | ICD-10-CM | POA: Insufficient documentation

## 2024-02-02 DIAGNOSIS — D508 Other iron deficiency anemias: Secondary | ICD-10-CM

## 2024-02-02 DIAGNOSIS — D509 Iron deficiency anemia, unspecified: Secondary | ICD-10-CM | POA: Diagnosis present

## 2024-02-02 LAB — CBC WITH DIFFERENTIAL/PLATELET
Abs Immature Granulocytes: 0.05 10*3/uL (ref 0.00–0.07)
Basophils Absolute: 0 10*3/uL (ref 0.0–0.1)
Basophils Relative: 0 %
Eosinophils Absolute: 0.2 10*3/uL (ref 0.0–0.5)
Eosinophils Relative: 2 %
HCT: 36.7 % (ref 36.0–46.0)
Hemoglobin: 11.1 g/dL — ABNORMAL LOW (ref 12.0–15.0)
Immature Granulocytes: 1 %
Lymphocytes Relative: 26 %
Lymphs Abs: 2.5 10*3/uL (ref 0.7–4.0)
MCH: 21.8 pg — ABNORMAL LOW (ref 26.0–34.0)
MCHC: 30.2 g/dL (ref 30.0–36.0)
MCV: 72 fL — ABNORMAL LOW (ref 80.0–100.0)
Monocytes Absolute: 0.5 10*3/uL (ref 0.1–1.0)
Monocytes Relative: 5 %
Neutro Abs: 6.1 10*3/uL (ref 1.7–7.7)
Neutrophils Relative %: 66 %
Platelets: 317 10*3/uL (ref 150–400)
RBC: 5.1 MIL/uL (ref 3.87–5.11)
RDW: 15.4 % (ref 11.5–15.5)
WBC: 9.3 10*3/uL (ref 4.0–10.5)
nRBC: 0 % (ref 0.0–0.2)

## 2024-02-02 LAB — VITAMIN B12: Vitamin B-12: 667 pg/mL (ref 180–914)

## 2024-02-02 LAB — FERRITIN: Ferritin: 142 ng/mL (ref 11–307)

## 2024-02-02 LAB — IRON AND TIBC
Iron: 78 ug/dL (ref 28–170)
Saturation Ratios: 26 % (ref 10.4–31.8)
TIBC: 300 ug/dL (ref 250–450)
UIBC: 222 ug/dL

## 2024-02-03 ENCOUNTER — Other Ambulatory Visit: Payer: Managed Care, Other (non HMO)

## 2024-02-03 LAB — KAPPA/LAMBDA LIGHT CHAINS
Kappa free light chain: 34.1 mg/L — ABNORMAL HIGH (ref 3.3–19.4)
Kappa, lambda light chain ratio: 1.58 (ref 0.26–1.65)
Lambda free light chains: 21.6 mg/L (ref 5.7–26.3)

## 2024-02-08 NOTE — Progress Notes (Unsigned)
 [facility] [address], [place] [address]   CLINIC:  Medical Oncology/Hematology  PCP:  Benedetto Brady, MD [address] [place] [address] [phone]   REASON FOR VISIT:  Follow-up for microcytic anemia  PRIOR THERAPY: Intermittent IV iron   CURRENT THERAPY: Iron  tablet  INTERVAL HISTORY:   Allison Henderson 63 y.o. female returns for routine follow-up of microcytic anemia.  She was last seen by Dr. Cheree Cords on 08/04/2023.  At today's visit, she reports feeling ***.  No recent hospitalizations, surgeries, or changes in baseline health status. *** She continues to take daily Fusion Plus iron  tablet *** as well as vitamin B12.  *** *** Bleeding *** Fatigue, pica *** Headaches, lightheadedness, syncope *** Chest pain, dyspnea on exertion  She has ***% energy and ***% appetite. She endorses that she is maintaining a stable weight.   ASSESSMENT & PLAN:  1.  Microcytic anemia: - Patient seen at the request of Brigitte Canard, PA at [facility] gastro[facility]ology for iron  deficiency anemia and chronic microcytosis (labs from 2000 show MCV as low as 52) - Hemoglobin fractionation cascade (08/14/2022) was normal, although this does not exclude alpha thalassemia. - No history of PRBC transfusion - BMBX in [facility] several years ago was reportedly normal - CTAP (07/08/2022): Fatty liver.  Spleen size normal. - EGD (12/22/2022): Hiatal hernia.  Otherwise normal. - Colonoscopy (12/22/2022): Polyp x 3.  Nonbleeding internal hemorrhoids. - Most recent IV iron  with Venofer  400 mg on 08/04/2023. - Denies any rectal bleeding or melena.  *** No vaginal bleeding.  *** - She continues to take iron  tablet in the form of fusion plus once daily.  She is on Protonix which potentially decreases oral iron  absorption. - She received vitamin B12 1000 mcg injection on 08/04/2023.  She also takes vitamin B12 supplement *** - Most recent labs (02/02/2024): Hgb 11.1/MCV  72.0.  Ferritin 142, iron  saturation 26%.  Vitamin B12 normal 667. - ***Symptoms *** - PLAN: No indication for IV iron .  Continue oral iron  supplementation and vitamin B12 supplement. - ***alpha thalassemia genotype testing??? *** - Labs and RTC in 6 months *** (CBC/D, BMP, ferritin, iron /TIBC, B12, MMA)  2.  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  SPEP negative for M spike.  Kappa light chains are elevated at 38.3, lambda light chains 23.8, FLC ratio was normal at 1.61.   - Repeat light chains (02/02/2024): Normal FLC ratio 1.58.  Mildly elevated kappa light chains 34.1, normal lambda 21.6. - PLAN: No clear evidence of plasma cell disorder at this time. - Will repeat SPEP, immunofixation, light chains, and UPEP in 1 year (April 2026).  If no definitive monoclonal protein, no further testing at that time.  3.  Lymphocytosis: - She had lymphocytosis previously for which flow cytometry was done which did not show any aberrant B or T-cell population. - Most recent labs (02/02/2024) with normal WBC and differential  4.  Social/family history: - Lives at home with her husband Porfirio Bristol.  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  She is a Lawyer.  Non-smoker. - Niece has sickle cell trait.  Father had chronic leukemia.  Sister had breast cancer at age 98.  Maternal grandmother had lymphoma.   PLAN SUMMARY: >> *** >> *** >> ***   [facility] **VISIT SUMMARY & IMPORTANT INSTRUCTIONS **   You were  seen today by Sheril Dines PA-C for your ***.    *** ***  *** ***  LABS: Return in ***   OTHER TESTS: ***  MEDICATIONS: ***  FOLLOW-UP APPOINTMENT: ***     REVIEW OF SYSTEMS: ***  Review of Systems - Oncology   PHYSICAL EXAM:  ECOG PERFORMANCE STATUS: {CHL ONC ECOG BJ:[id number]} *** There were no vitals filed for this visit. There were no vitals  filed for this visit. Physical Exam  PAST MEDICAL/SURGICAL HISTORY:  Past Medical History:  Diagnosis Date   Anemia    Arthritis    Calcaneal spur of foot    Carpal tunnel syndrome on both sides    Essential hypertension    Frozen shoulder    Left   Gastroesophageal reflux disease    Hypertension    Insulin  resistance    Knee joint pain    Bilateral;  R>L   Left sided sciatica    Lumbar paraspinal muscle spasm    Tendonitis of ankle, left    Thalassemia trait    Past Surgical History:  Procedure Laterality Date   BIOPSY  12/22/2022   Procedure: BIOPSY;  Surgeon: Genell Ken, MD;  Location: [facility];  Service: Gastroenterology;;   BREAST BIOPSY Right 11/06/2022   MM RT BREAST BX W LOC DEV 1ST LESION IMAGE BX SPEC STEREO GUIDE 11/06/2022 [facility]   BREAST BIOPSY Right 11/06/2022   MM RT BREAST BX W LOC DEV EA AD LESION IMG BX SPEC STEREO GUIDE 11/06/2022 [facility]   COLONOSCOPY WITH PROPOFOL  N/A 12/22/2022   Procedure: COLONOSCOPY WITH PROPOFOL ;  Surgeon: Genell Ken, MD;  Location: [facility];  Service: Gastroenterology;  Laterality: N/A;   ESOPHAGOGASTRODUODENOSCOPY (EGD) WITH PROPOFOL  N/A 12/22/2022   Procedure: ESOPHAGOGASTRODUODENOSCOPY (EGD) WITH PROPOFOL ;  Surgeon: Genell Ken, MD;  Location: [facility];  Service: Gastroenterology;  Laterality: N/A;   POLYPECTOMY  12/22/2022   Procedure: POLYPECTOMY;  Surgeon: Genell Ken, MD;  Location: [facility];  Service: Gastroenterology;;  polypectomy using cold forceps   TOTAL ABDOMINAL HYSTERECTOMY      SOCIAL HISTORY:  Social History   Socioeconomic History   Marital status: Married    Spouse name: Not on file   Number of children: Not on file   Years of education: Not on file   Highest education level: Not on file  Occupational History   Occupation: Engineer, site    Comment: [employer]  Tobacco Use   Smoking status: Never   Smokeless tobacco: Never  Substance and Sexual  Activity   Alcohol use: No   Drug use: No   Sexual activity: Yes    Partners: Male    Birth control/protection: Other-see comments    Comment: NO CYCLES; PT HAD HYST  Other Topics Concern   Not on file  Social History Narrative   Not on file   Social Drivers of Health   Financial Resource Strain: Not on file  Food Insecurity: No Food Insecurity (10/15/2021)   Hunger Vital Sign    Worried About Running Out of Food in the Last Year: Never true    Ran Out of Food in the Last Year: Never true  Transportation Needs: Not on file  Physical Activity: Not on file  Stress: Not on file  Social Connections: Not on file  Intimate Partner Violence: Not on file    FAMILY HISTORY:  Family History  Problem Relation Age of Onset   Hypertension Mother    Osteoarthritis Mother    Anemia Mother  Hypertension Father    Diabetes Father    Breast cancer Sister 1   Anemia Sister    Hypertension Brother    Diabetes Brother    Lymphoma Maternal Grandmother    Emphysema Maternal Grandfather    Heart attack Paternal Grandfather    Colon cancer Other    Hyperlipidemia Other    Stroke Other     CURRENT MEDICATIONS:  Outpatient Encounter Medications as of 02/09/2024  Medication Sig Note   ACCU-CHEK GUIDE test strip USE TO CHECK FASTING BLOOD SUGAR EVERY MORNING 90    albuterol  (PROVENTIL  HFA;VENTOLIN  HFA) 108 (90 BASE) MCG/ACT inhaler Inhale 2 puffs into the lungs every 6 (six) hours as needed for wheezing.    aspirin EC 81 MG tablet Take 81 mg by mouth daily. Swallow whole.    atorvastatin (LIPITOR) 10 MG tablet Take 10 mg by mouth at bedtime.    Biotin 5000 MCG TABS Take 5,000 mcg by mouth daily.    Cholecalciferol (VITAMIN D ) 50 MCG (2000 UT) CAPS Take 2,000 Units by mouth daily.    cyanocobalamin  1000 MCG tablet Take 1,000 mcg by mouth daily.    cyclobenzaprine  (FLEXERIL ) 10 MG tablet Take 10 mg by mouth 3 (three) times daily as needed for muscle spasms.    docusate sodium (COLACE) 100  MG capsule Take 100 mg by mouth daily.    famotidine  (PEPCID ) 20 MG tablet TAKE 2 TABLETS BY MOUTH DAILY AS DIRECTED    fluticasone (FLONASE) 50 MCG/ACT nasal spray Place 1 spray into both nostrils daily as needed for allergies.    hydrochlorothiazide (HYDRODIURIL) 12.5 MG tablet Take 12.5 mg by mouth daily.    HYDROcodone-acetaminophen (NORCO) 10-325 MG tablet TAKE 1 TABLET 3 TIMES A DAY BY ORAL ROUTE AS NEEDED.    ibuprofen (ADVIL,MOTRIN) 800 MG tablet TAKE 1 TABLET EVERY 8 HOURS AS NEEDED FOR PAIN    Iron -FA-B Cmp-C-Biot-Probiotic (FUSION PLUS) CAPS Take 1 capsule by mouth daily.    LINZESS 290 MCG CAPS capsule Take 290 mcg by mouth every morning.    lisinopril  (PRINIVIL ,ZESTRIL ) 10 MG tablet Take 1 tablet (10 mg total) by mouth 2 (two) times daily.    Magnesium 400 MG TABS Take 400 mg by mouth daily.    meloxicam (MOBIC) 7.5 MG tablet Take 7.5 mg by mouth 2 (two) times daily as needed for pain.    metFORMIN  (GLUCOPHAGE ) 500 MG tablet Take 500 mg by mouth daily with breakfast.    metFORMIN  (GLUCOPHAGE -XR) 500 MG 24 hr tablet TAKE 1 TABLET WITH EVENING MEAL EVERY DAY BY MOUTH    MOUNJARO 12.5 MG/0.5ML Pen INJECT 12.5 MG SUBCUTANEOUSLY ONCE A WEEK    MOUNJARO 7.5 MG/0.5ML Pen Inject 12.5 mg into the skin every Wednesday.    Multiple Vitamins-Minerals (MULTIVITAMIN WITH MINERALS) tablet Take 1 tablet by mouth daily.    nitrofurantoin (MACRODANTIN) 50 MG capsule Take 50 mg by mouth See admin instructions. Take after having intercourse    pantoprazole (PROTONIX) 40 MG tablet Take 40 mg by mouth daily as needed (acid reflux).    traMADol (ULTRAM) 50 MG tablet Take 50 mg by mouth every 6 (six) hours as needed for moderate pain or severe pain. 02/11/2016: Received from: External Pharmacy Received Sig: Take by mouth every 6 (six) hours as needed.   vitamin E 400 UNIT capsule Take 400 Units by mouth daily.    No facility-administered encounter medications on file as of 02/09/2024.    ALLERGIES:   Allergies  Allergen Reactions  Peanut-Containing Drug Products Hives and Itching   Bextra [Valdecoxib] Hives   Morphine And Codeine  Itching   Other     Pt is allergic to many eye drops.    Penicillins Hives and Swelling    No Ciillins per pt. Has patient had a PCN reaction causing immediate rash, facial/tongue/throat swelling, SOB or lightheadedness with hypotension: No Has patient had a PCN reaction causing severe rash involving mucus membranes or skin necrosis: No Has patient had a PCN reaction that required hospitalization No Has patient had a PCN reaction occurring within the last 10 years: No If all of the above answers are "NO", then may proceed with Cephalosporin use.   Strawberry Flavoring Agent (Non-Screening) Itching   Tobramycin Hives    LABORATORY DATA:  I have reviewed the labs as listed.  CBC    Component Value Date/Time   WBC 9.3 02/02/2024 1027   RBC 5.10 02/02/2024 1027   HGB 11.1 (L) 02/02/2024 1027   HCT 36.7 02/02/2024 1027   PLT 317 02/02/2024 1027   MCV 72.0 (L) 02/02/2024 1027   MCH 21.8 (L) 02/02/2024 1027   MCHC 30.2 02/02/2024 1027   RDW 15.4 02/02/2024 1027   LYMPHSABS 2.5 02/02/2024 1027   MONOABS 0.5 02/02/2024 1027   EOSABS 0.2 02/02/2024 1027   BASOSABS 0.0 02/02/2024 1027      Latest Ref Rng & Units 07/28/2023   11:13 AM 08/14/2022   11:58 AM 01/12/2018   11:22 AM  CMP  Glucose 70 - 99 mg/dL 109  604    BUN 6 - 20 mg/dL 11  13    Creatinine 5.40 - 1.00 mg/dL 9.81  1.91    Sodium 478 - 145 mmol/L 135  138    Potassium 3.5 - 5.1 mmol/L 3.6  3.8    Chloride 98 - 111 mmol/L 100  105    CO2 22 - 32 mmol/L 27  26    Calcium 8.9 - 10.3 mg/dL 8.9  8.9    Total Protein 6.5 - 8.1 g/dL 6.7  6.9  7.1   Total Bilirubin 0.3 - 1.2 mg/dL 0.6  0.4    Alkaline Phos 38 - 126 U/L 75  57    AST 15 - 41 U/L 17  17    ALT 0 - 44 U/L 22  18      DIAGNOSTIC IMAGING:  I have independently reviewed the relevant imaging and discussed with the  patient.   WRAP UP:  All questions were answered. The patient knows to call the clinic with any problems, questions or concerns.  Medical decision making: ***  Time spent on visit: I spent *** minutes counseling the patient face to face. The total time spent in the appointment was *** minutes and more than 50% was on counseling.  Sonnie Dusky, PA-C  ***

## 2024-02-09 ENCOUNTER — Inpatient Hospital Stay (HOSPITAL_BASED_OUTPATIENT_CLINIC_OR_DEPARTMENT_OTHER): Payer: Managed Care, Other (non HMO) | Admitting: Physician Assistant

## 2024-02-09 ENCOUNTER — Inpatient Hospital Stay

## 2024-02-09 VITALS — BP 121/72 | HR 80 | Temp 98.5°F | Resp 17 | Wt 258.4 lb

## 2024-02-09 DIAGNOSIS — D509 Iron deficiency anemia, unspecified: Secondary | ICD-10-CM

## 2024-02-09 DIAGNOSIS — E538 Deficiency of other specified B group vitamins: Secondary | ICD-10-CM

## 2024-02-09 DIAGNOSIS — D508 Other iron deficiency anemias: Secondary | ICD-10-CM

## 2024-02-09 MED ORDER — FERROUS BISGLYCINATE CHELATE 28 MG PO CAPS: 28.0000 mg | ORAL_CAPSULE | Freq: Every day | ORAL | 3 refills | Status: AC

## 2024-02-09 NOTE — Patient Instructions (Signed)
 [facility] **VISIT SUMMARY & IMPORTANT INSTRUCTIONS **   You were seen today by Sheril Dines PA-C for your chronic anemia.    IRON  DEFICIENCY: Your iron  levels are currently normal.  You do not need any IV iron  at this time, but should continue taking iron  supplement pills at home. Take FERROUS BISGLYCINATE  (sometimes called SlowFe or Gentle Iron ). Take your iron  tablet daily.  If it causes too much constipation, you can decrease to EVERY OTHER DAY. Take your iron  pill with a glass of orange juice or other source of vitamin C to help your body absorb it better. Take your iron  separated by at least 2 hours from your antacid medication.  CHRONIC ANEMIA & MICROCYTOSIS You have chronic anemia and microcytosis (small red blood cells). This most likely indicates an underlying inherited condition called "alpha thalassemia trait." We will check test today to confirm whether or not you have alpha thalassemia trait.  (Test results take 4 to 8 weeks to result.) Confirmation of alpha thalassemia trait would not change the management of your anemia, but would explain why you have chronically low hemoglobin and small red blood cells.  VITAMIN B12 DEFICIENCY Continue taking vitamin B12 tablet daily.  ELEVATED LIGHT CHAINS Your labs dated NOT show any evidence of multiple myeloma cancer as the cause of elevated light chains.  Will recheck these labs in 1 year (April 2026). Elevated kappa light chains are commonly caused by benign conditions such as chronic inflammation, stress, infection, kidney impairment, liver disease, and many other causes.  ELEVATED WHITE BLOOD CELLS: This is likely a response to inflammation.  Your most recent white blood cells are normal.  FOLLOW-UP APPOINTMENT: Labs and office visit in 6 months  ** Thank you for trusting me with your healthcare!  I strive to provide all of my patients with quality care at each visit.  If you receive a  survey for this visit, I would be so grateful to you for taking the time to provide feedback.  Thank you in advance!  ~ Mikesha Migliaccio                   Dr. Paulett Boros   &   Sheril Dines, PA-C   - - - - - - - - - - - - - - - - - -    Thank you for choosing [facility] to provide your oncology and hematology care.  To afford each patient quality time with our provider, please arrive at least 15 minutes before your scheduled appointment time.   If you have a lab appointment with the Cancer Center please come in thru the Main Entrance and check in at the main information desk.  You need to re-schedule your appointment should you arrive 10 or more minutes late.  We strive to give you quality time with our providers, and arriving late affects you and other patients whose appointments are after yours.  Also, if you no show three or more times for appointments you may be dismissed from the clinic at the providers discretion.     Again, thank you for choosing [facility].  Our hope is that these requests will decrease the amount of time that you wait before being seen by our physicians.       _____________________________________________________________  Should you have questions after your visit to [facility], please contact our office at [phone] and  follow the prompts.  Our office hours are 8:00 a.m. and 4:30 p.m. Monday - Friday.  Please note that voicemails left after 4:00 p.m. may not be returned until the following business day.  We are closed weekends and major holidays.  You do have access to a nurse 24-7, just call the main number to the clinic [phone] and do not press any options, hold on the line and a nurse will answer the phone.    For prescription refill requests, have your pharmacy contact our office and allow 72 hours.

## 2024-02-10 ENCOUNTER — Ambulatory Visit: Payer: Managed Care, Other (non HMO) | Admitting: Oncology

## 2024-02-13 IMAGING — US US ABDOMEN LIMITED
1 series · 14 of 25 positions shown · non-contrast
Comparison: Ultrasound abdomen complete 04/29/2015.

CLINICAL DATA: Right upper quadrant pain.

EXAM:
ULTRASOUND ABDOMEN LIMITED RIGHT UPPER QUADRANT

[Series 1: us abdomen limited · 0.25mm/px · 14 of 41 slices shown]
[im 1/41]
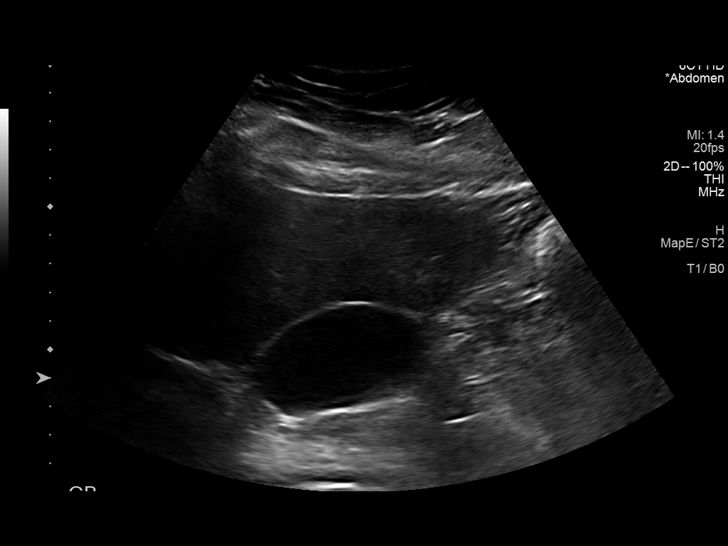
[im 4/41]
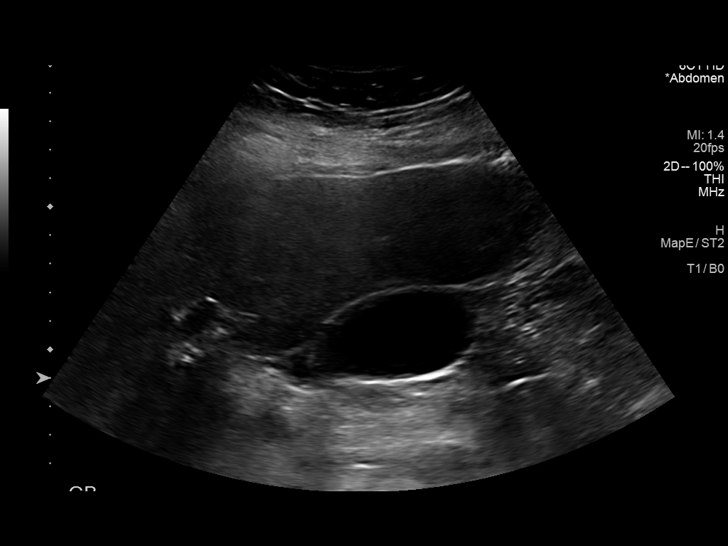
[im 7/41]
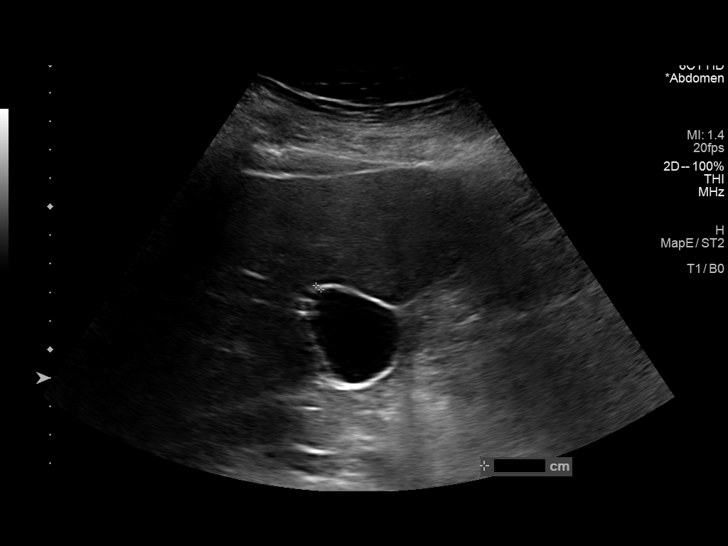
[im 11/41]
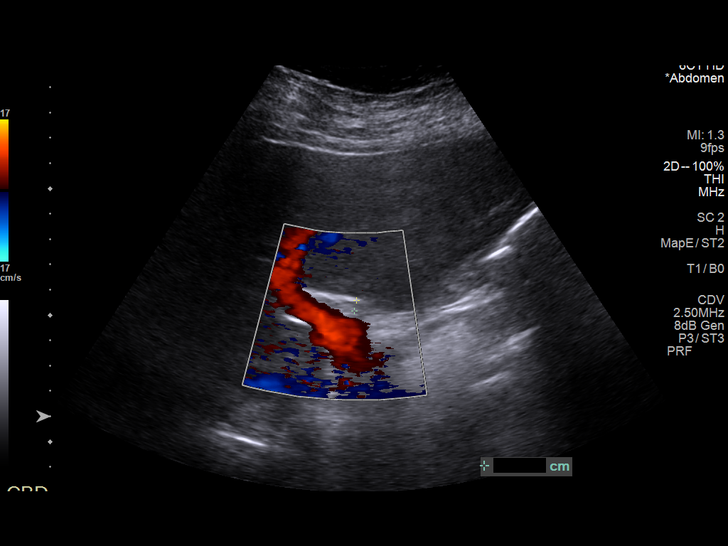
[im 14/41]
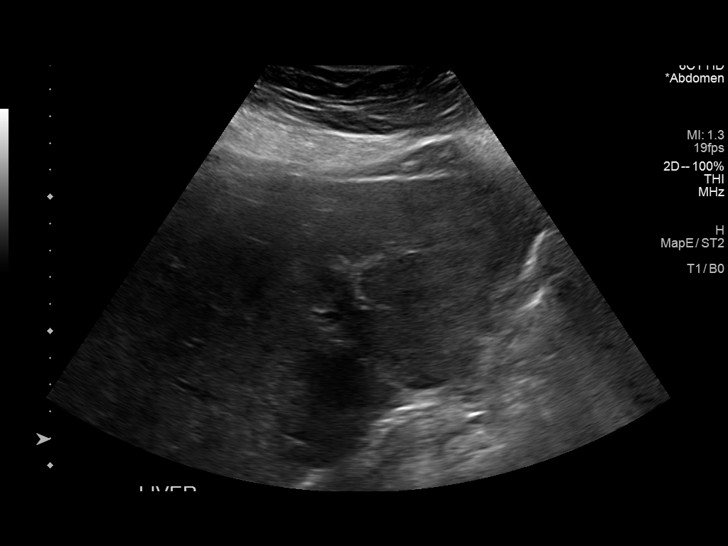
[im 16/41]
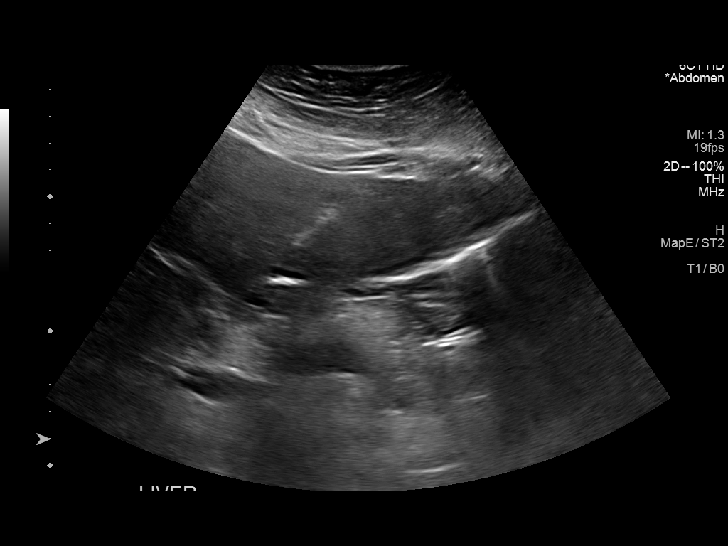
[im 19/41]
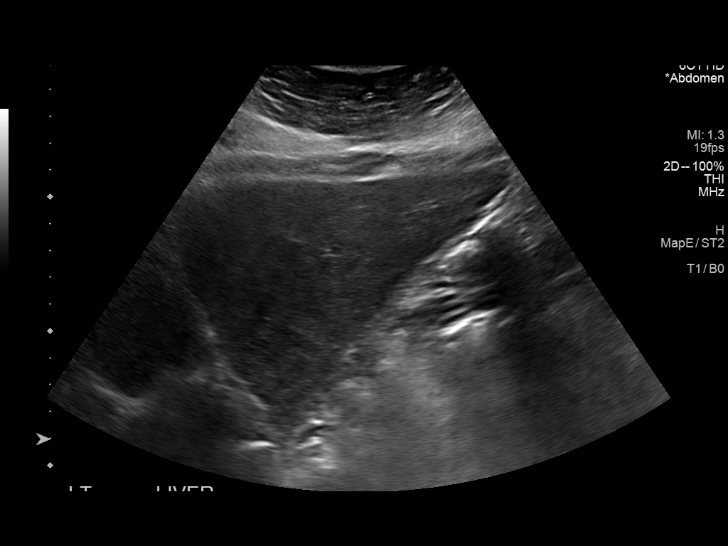
[im 22/41]
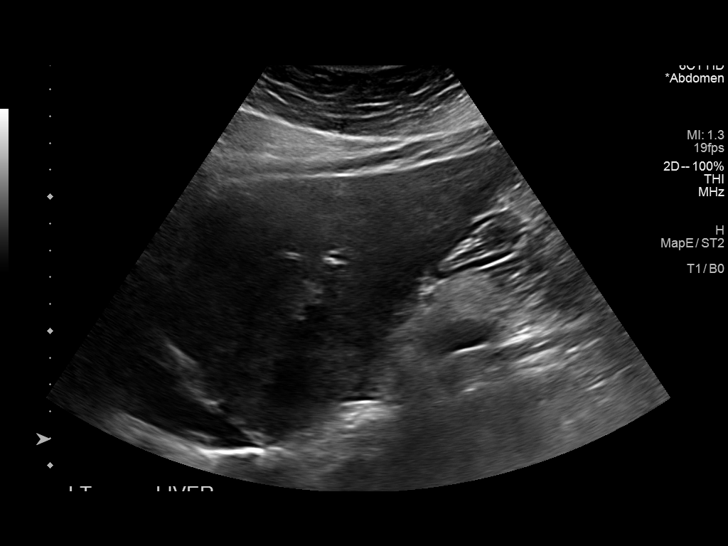
[im 26/41]
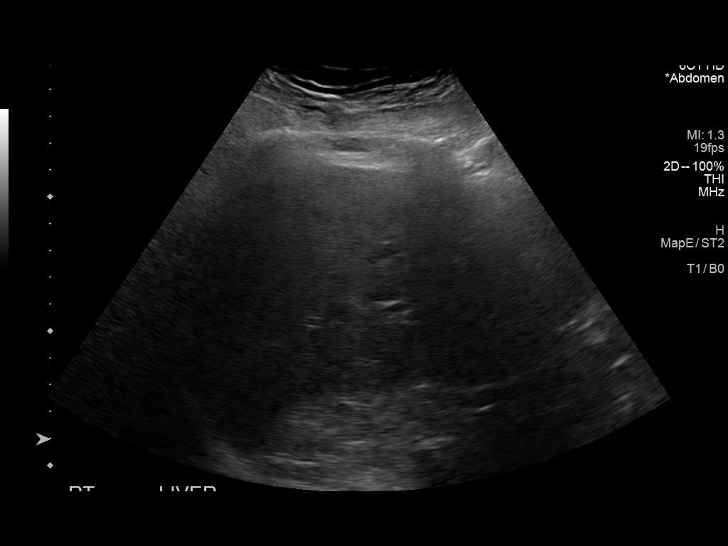
[im 27/41]
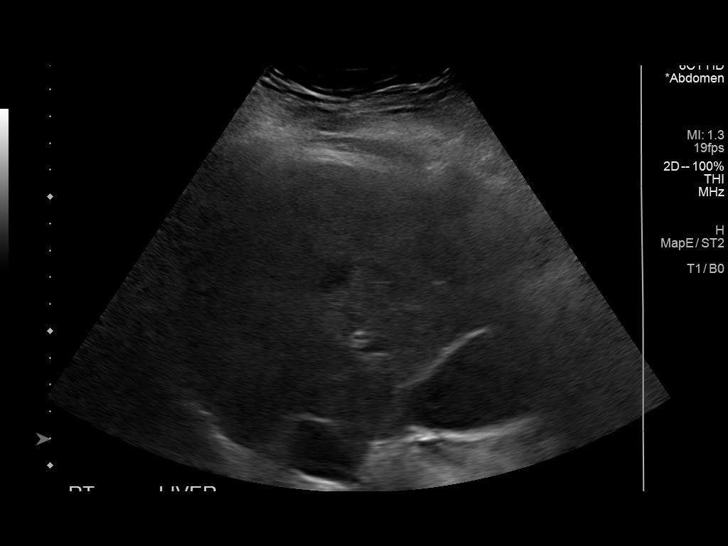
[im 31/41]
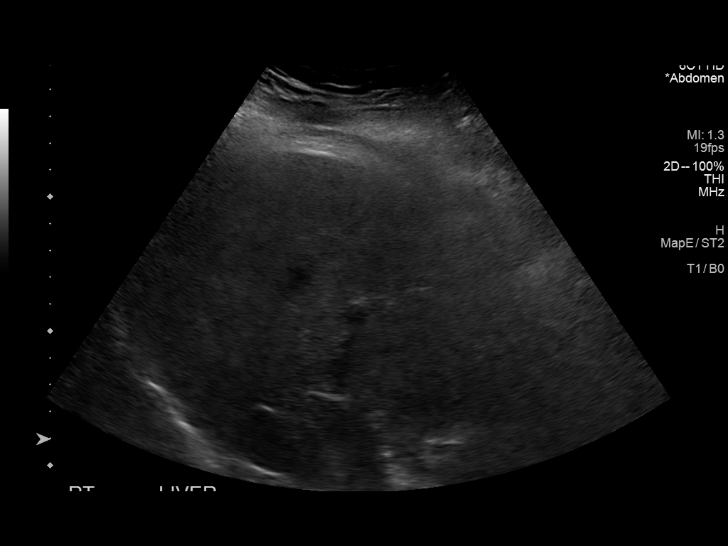
[im 34/41]
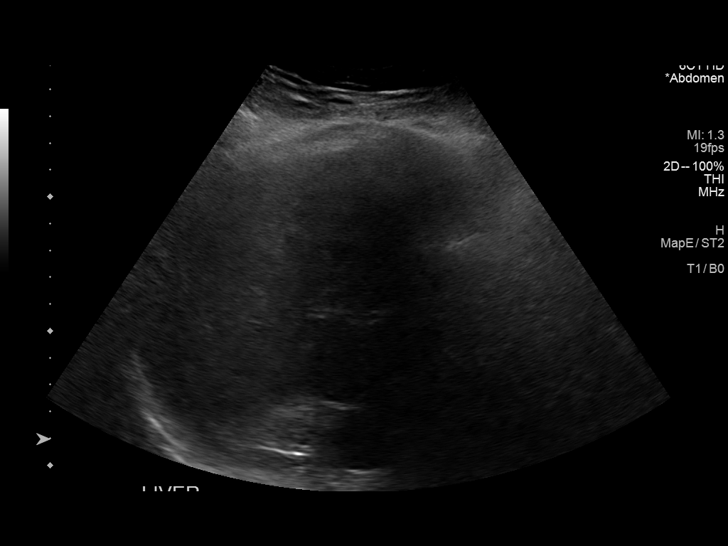
[im 37/41]
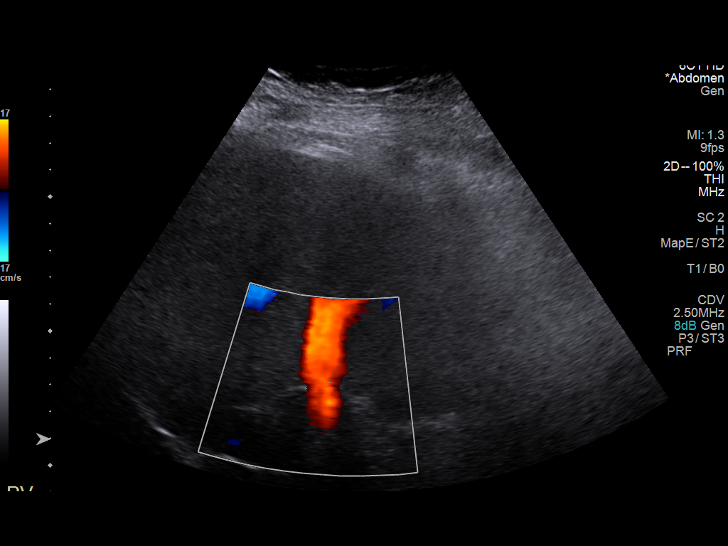
[im 41/41]
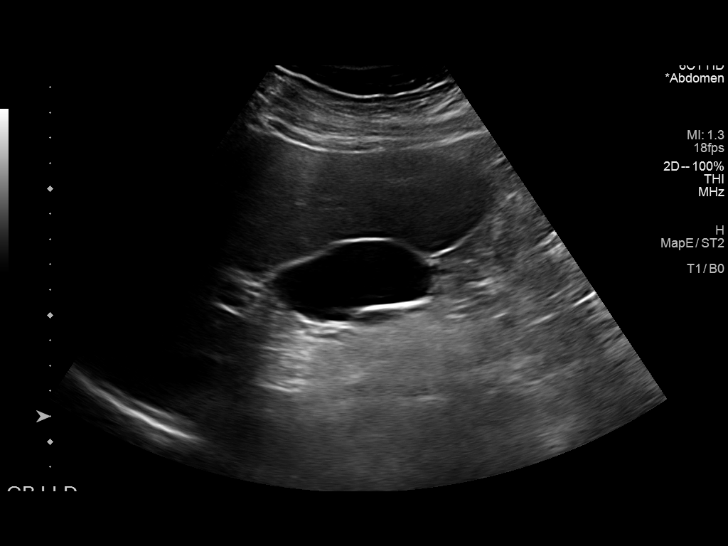

[14 of 25 positions shown; findings below may reference images not displayed]

FINDINGS: Gallbladder:

No gallstones or wall thickening visualized. No sonographic Murphy
sign noted by sonographer.

Common bile duct:

Diameter: 4 mm.

Liver:

No focal lesion identified. Within normal limits in parenchymal
echogenicity. Portal vein is patent on color Doppler imaging with
normal direction of blood flow towards the liver.

Other: None.
IMPRESSION: Normal right upper quadrant ultrasound.

## 2024-02-21 LAB — ALPHA-THALASSEMIA GENOTYPR

## 2024-02-22 ENCOUNTER — Encounter: Payer: Self-pay | Admitting: Physician Assistant

## 2024-08-08 ENCOUNTER — Other Ambulatory Visit: Payer: Self-pay

## 2024-08-09 ENCOUNTER — Inpatient Hospital Stay: Attending: Physician Assistant

## 2024-08-09 DIAGNOSIS — D509 Iron deficiency anemia, unspecified: Secondary | ICD-10-CM | POA: Diagnosis present

## 2024-08-09 DIAGNOSIS — R7689 Other specified abnormal immunological findings in serum: Secondary | ICD-10-CM | POA: Diagnosis not present

## 2024-08-09 DIAGNOSIS — E538 Deficiency of other specified B group vitamins: Secondary | ICD-10-CM

## 2024-08-09 DIAGNOSIS — D72820 Lymphocytosis (symptomatic): Secondary | ICD-10-CM | POA: Diagnosis not present

## 2024-08-09 DIAGNOSIS — D508 Other iron deficiency anemias: Secondary | ICD-10-CM

## 2024-08-09 LAB — BASIC METABOLIC PANEL WITH GFR
Anion gap: 9 (ref 5–15)
BUN: 16 mg/dL (ref 8–23)
CO2: 27 mmol/L (ref 22–32)
Calcium: 9.4 mg/dL (ref 8.9–10.3)
Chloride: 102 mmol/L (ref 98–111)
Creatinine, Ser: 0.96 mg/dL (ref 0.44–1.00)
GFR, Estimated: >60 mL/min (ref >60)
Glucose, Bld: 99 mg/dL (ref 70–99)
Potassium: 4.3 mmol/L (ref 3.5–5.1)
Sodium: 138 mmol/L (ref 135–145)

## 2024-08-09 LAB — CBC WITH DIFFERENTIAL/PLATELET
Abs Immature Granulocytes: 0.04 K/uL (ref 0.00–0.07)
Basophils Absolute: 0 K/uL (ref 0.0–0.1)
Basophils Relative: 0 %
Eosinophils Absolute: 0.1 K/uL (ref 0.0–0.5)
Eosinophils Relative: 1 %
HCT: 40.3 % (ref 36.0–46.0)
Hemoglobin: 12.1 g/dL (ref 12.0–15.0)
Immature Granulocytes: 0 %
Lymphocytes Relative: 30 %
Lymphs Abs: 2.9 K/uL (ref 0.7–4.0)
MCH: 21.4 pg — ABNORMAL LOW (ref 26.0–34.0)
MCHC: 30 g/dL (ref 30.0–36.0)
MCV: 71.3 fL — ABNORMAL LOW (ref 80.0–100.0)
Monocytes Absolute: 0.7 K/uL (ref 0.1–1.0)
Monocytes Relative: 7 %
Neutro Abs: 6.1 K/uL (ref 1.7–7.7)
Neutrophils Relative %: 62 %
Platelets: 313 K/uL (ref 150–400)
RBC: 5.65 MIL/uL — ABNORMAL HIGH (ref 3.87–5.11)
RDW: 15.4 % (ref 11.5–15.5)
WBC: 9.9 K/uL (ref 4.0–10.5)
nRBC: 0 % (ref 0.0–0.2)

## 2024-08-09 LAB — IRON AND TIBC
Iron: 57 ug/dL (ref 28–170)
Saturation Ratios: 18 % (ref 10.4–31.8)
TIBC: 307 ug/dL (ref 250–450)
UIBC: 250 ug/dL

## 2024-08-09 LAB — VITAMIN B12: Vitamin B-12: 1428 pg/mL — ABNORMAL HIGH (ref 180–914)

## 2024-08-09 LAB — FERRITIN: Ferritin: 265 ng/mL (ref 11–307)

## 2024-08-16 ENCOUNTER — Ambulatory Visit: Admitting: Physician Assistant

## 2024-08-16 LAB — METHYLMALONIC ACID, SERUM: Methylmalonic Acid, Quantitative: 89 nmol/L (ref 0–378)

## 2024-08-22 ENCOUNTER — Ambulatory Visit: Admitting: Physician Assistant

## 2024-08-22 NOTE — Progress Notes (Unsigned)
 [facility] [address], [place] [address]   CLINIC:  Medical Oncology/Hematology  PCP:  Allison Cole, MD [address] [place] [address] [phone]   REASON FOR VISIT:  Follow-up for microcytic anemia  PRIOR THERAPY: Intermittent IV iron   CURRENT THERAPY: Iron  tablet  INTERVAL HISTORY:   [name] 63 y.o. female returns for routine follow-up of microcytic anemia.   She was last seen by Allison Barefoot PA-C on 02/09/2024. She is accompanied today by her husband, Allison Henderson.  At today's visit, she reports feeling fair, apart from some new headaches over the past week. No recent hospitalizations, surgeries, or changes in baseline health status.    She has 50% energy and 75% appetite. She endorses that she is maintaining a stable weight.  She continues to take EOD iron  tablet as well as vitamin B12 daily, but reports iron  induced constipation despite also being on Linzess. She has ongoing fatigue, which has previously improved after IV iron . She reports headaches and lightheadedness. Denies any pica, syncope, chest pain, or dyspnea on exertion. No rectal bleeding or melena.  ASSESSMENT & PLAN:  1.  Microcytic anemia, alpha thalassemia trait - Patient seen at the request of Allison Console, PA at [facility] gastroenterology for iron  deficiency anemia and chronic microcytosis (labs from 2000 show MCV as low as 52).  She believes that her mother may have thalassemia trait. - Hemoglobin fractionation cascade (08/14/2022) was normal, although this does not exclude alpha thalassemia. - Alpha thalassemia genotype (02/09/2024): Positive for alpha thalassemia trait - No history of PRBC transfusion - BMBX in [facility] several years ago was reportedly normal - CTAP (07/08/2022): Fatty liver.  Spleen size normal. - EGD (12/22/2022): Hiatal hernia.  Otherwise normal. - Colonoscopy (12/22/2022): Polyp x 3.  Nonbleeding internal hemorrhoids. - Most  recent IV iron  with Venofer  400 mg on 08/04/2023. - Denies any rectal bleeding or melena.  - She continues to take iron  tablet every other day.  She is on Protonix which potentially decreases oral iron  absorption. - She received vitamin B12 1000 mcg injection on 08/04/2023.  She also takes vitamin B12 supplement  - Most recent labs (08/09/2024): Hgb 12.1/MCV 71.3, elevated RBC 5.65.  Ferritin 265, iron  saturation 18%.  Vitamin B12 mildly elevated 1428, normal MMA.  Normal creatinine. - She has ongoing fatigue  - PLAN: No indication for IV iron . - Hemoglobin at baseline.  Ongoing microcytosis and mild intermittent anemia related to alpha thalassemia trait.  No treatment necessary. - Recommend DECREASING vitamin B12 and iron  supplements to be taken once or twice weekly to maintain adequate B12 and iron  levels. - Will recheck labs in 1 year, if stable will discharge to PCP at that time.  2.  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  SPEP negative for M spike.  Kappa light chains are elevated at 38.3, lambda light chains 23.8, FLC ratio was normal at 1.61.   -- ANA and RF were negative in 2019 - Repeat light chains (02/02/2024): Normal FLC ratio 1.58.  Mildly elevated kappa light chains 34.1, normal lambda 21.6. - PLAN: No clear evidence of plasma cell disorder at this time. - Will repeat SPEP, immunofixation, light chains, and UPEP in 1 year.  If no definitive monoclonal protein, no further testing at that time.  3.  Lymphocytosis: - She had lymphocytosis previously for which flow cytometry was done which did not show any aberrant B or  T-cell population. - Most recent labs (08/09/2024) with normal WBC and differential  4.  Social/family history: - Lives at home with her husband Allison Henderson.  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  She is a LAWYER.  Non-smoker. - Niece has sickle cell trait.  Father had  chronic leukemia.  Sister had breast cancer at age 40.  Maternal grandmother had lymphoma.   PLAN SUMMARY: >> Labs in 1 year = CBC/D, BMP, ferritin, iron /TIBC, B12, MMA, SPEP, immunofixation, light chains >> 24-hour urine/UPEP in 1 year >> OFFICE visit in 1 year (1 week after labs)     REVIEW OF SYSTEMS:   Review of Systems  Constitutional:  Positive for fatigue. Negative for appetite change, chills, diaphoresis, fever and unexpected weight change.  HENT:   Negative for lump/mass and nosebleeds.   Eyes:  Negative for eye problems.  Respiratory:  Negative for cough, hemoptysis and shortness of breath.   Cardiovascular:  Negative for chest pain, leg swelling and palpitations.  Gastrointestinal:  Positive for constipation and nausea. Negative for abdominal pain, blood in stool, diarrhea and vomiting.  Genitourinary:  Negative for hematuria.   Musculoskeletal:  Positive for arthralgias.  Skin: Negative.   Neurological:  Positive for dizziness and headaches. Negative for light-headedness and numbness.  Hematological:  Does not bruise/bleed easily.  Psychiatric/Behavioral:  Positive for sleep disturbance.      PHYSICAL EXAM:  ECOG PERFORMANCE STATUS: 2 - Symptomatic, <50% confined to bed  Vitals:   08/23/24 0912  BP: 135/85  Pulse: 64  Resp: 18  Temp: 97.7 F (36.5 C)  SpO2: 97%    Filed Weights   08/23/24 0912  Weight: 247 lb (112 kg)    Physical Exam Constitutional:      Appearance: Normal appearance. She is morbidly obese.  Cardiovascular:     Heart sounds: Normal heart sounds.  Pulmonary:     Breath sounds: Normal breath sounds.  Neurological:     General: No focal deficit present.     Mental Status: Mental status is at baseline.  Psychiatric:        Behavior: Behavior normal. Behavior is cooperative.    PAST MEDICAL/SURGICAL HISTORY:  Past Medical History:  Diagnosis Date   Anemia    Arthritis    Calcaneal spur of foot    Carpal tunnel syndrome on both  sides    Essential hypertension    Frozen shoulder    Left   Gastroesophageal reflux disease    Hypertension    Insulin  resistance    Knee joint pain    Bilateral;  R>L   Left sided sciatica    Lumbar paraspinal muscle spasm    Tendonitis of ankle, left    Thalassemia trait    Past Surgical History:  Procedure Laterality Date   BIOPSY  12/22/2022   Procedure: BIOPSY;  Surgeon: Saintclair Jasper, MD;  Location: [facility];  Service: Gastroenterology;;   BREAST BIOPSY Right 11/06/2022   MM RT BREAST BX W LOC DEV 1ST LESION IMAGE BX SPEC STEREO GUIDE 11/06/2022 [facility]   BREAST BIOPSY Right 11/06/2022   MM RT BREAST BX W LOC DEV EA AD LESION IMG BX SPEC STEREO GUIDE 11/06/2022 [facility]   COLONOSCOPY WITH PROPOFOL  N/A 12/22/2022   Procedure: COLONOSCOPY WITH PROPOFOL ;  Surgeon: Saintclair Jasper, MD;  Location: [facility];  Service: Gastroenterology;  Laterality: N/A;   ESOPHAGOGASTRODUODENOSCOPY (EGD) WITH PROPOFOL  N/A 12/22/2022   Procedure: ESOPHAGOGASTRODUODENOSCOPY (EGD) WITH PROPOFOL ;  Surgeon:  Saintclair Jasper, MD;  Location: [facility];  Service: Gastroenterology;  Laterality: N/A;   POLYPECTOMY  12/22/2022   Procedure: POLYPECTOMY;  Surgeon: Saintclair Jasper, MD;  Location: [facility];  Service: Gastroenterology;;  polypectomy using cold forceps   TOTAL ABDOMINAL HYSTERECTOMY      SOCIAL HISTORY:  Social History   Socioeconomic History   Marital status: Married    Spouse name: Not on file   Number of children: Not on file   Years of education: Not on file   Highest education level: Not on file  Occupational History   Occupation: Engineer, Site    Comment: [employer]  Tobacco Use   Smoking status: Never   Smokeless tobacco: Never  Substance and Sexual Activity   Alcohol use: No   Drug use: No   Sexual activity: Yes    Partners: Male    Birth control/protection: Other-see comments    Comment: NO CYCLES; PT HAD HYST  Other Topics Concern    Not on file  Social History Narrative   Not on file   Social Drivers of Health   Financial Resource Strain: Not on file  Food Insecurity: No Food Insecurity (10/15/2021)   Hunger Vital Sign    Worried About Running Out of Food in the Last Year: Never true    Ran Out of Food in the Last Year: Never true  Transportation Needs: Not on file  Physical Activity: Not on file  Stress: Not on file  Social Connections: Not on file  Intimate Partner Violence: Not on file    FAMILY HISTORY:  Family History  Problem Relation Age of Onset   Hypertension Mother    Osteoarthritis Mother    Anemia Mother    Hypertension Father    Diabetes Father    Breast cancer Sister 52   Anemia Sister    Hypertension Brother    Diabetes Brother    Lymphoma Maternal Grandmother    Emphysema Maternal Grandfather    Heart attack Paternal Grandfather    Colon cancer Other    Hyperlipidemia Other    Stroke Other     CURRENT MEDICATIONS:  Outpatient Encounter Medications as of 08/23/2024  Medication Sig   atorvastatin (LIPITOR) 20 MG tablet Take 20 mg by mouth daily.   ACCU-CHEK GUIDE test strip USE TO CHECK FASTING BLOOD SUGAR EVERY MORNING 90   Accu-Chek Softclix Lancets lancets use to check fasting blood suagr finger stick every morning. Dx: E11.9   albuterol  (PROVENTIL  HFA;VENTOLIN  HFA) 108 (90 BASE) MCG/ACT inhaler Inhale 2 puffs into the lungs every 6 (six) hours as needed for wheezing.   aspirin EC 81 MG tablet Take 81 mg by mouth daily. Swallow whole.   Azelastine-Fluticasone 137-50 MCG/ACT SUSP PLACE 1 SPRAY IN EACH NOSTRIL DAILY X 7 DAYS THEN AS NEEDED   Biotin 5000 MCG TABS Take 5,000 mcg by mouth daily.   Blood Pressure Monitor MISC See admin instructions.   Cholecalciferol (VITAMIN D ) 50 MCG (2000 UT) CAPS Take 2,000 Units by mouth daily.   cyanocobalamin  1000 MCG tablet Take 1,000 mcg by mouth daily.   cyclobenzaprine  (FLEXERIL ) 10 MG tablet Take 10 mg by mouth 3 (three) times daily as  needed for muscle spasms.   docusate sodium (COLACE) 100 MG capsule Take 100 mg by mouth daily.   famotidine  (PEPCID ) 40 MG tablet TAKE 1 TABLET BY MOUTH EVERY DAY FOR 90 DAYS   Ferrous Bisglycinate  Chelate 28 MG CAPS Take 28 mg by mouth daily.  fluticasone (FLONASE) 50 MCG/ACT nasal spray Place 1 spray into both nostrils daily as needed for allergies.   hydrochlorothiazide (HYDRODIURIL) 12.5 MG tablet Take 12.5 mg by mouth daily.   HYDROcodone-acetaminophen (NORCO) 10-325 MG tablet TAKE 1 TABLET 3 TIMES A DAY BY ORAL ROUTE AS NEEDED.   ibuprofen (ADVIL,MOTRIN) 800 MG tablet TAKE 1 TABLET EVERY 8 HOURS AS NEEDED FOR PAIN   ipratropium-albuterol  (DUONEB) 0.5-2.5 (3) MG/3ML SOLN TAKE 1 (INHALATION) EVERY 4 HOURS FOR 10 DAYS   LINZESS 290 MCG CAPS capsule Take 290 mcg by mouth every morning.   lisinopril  (PRINIVIL ,ZESTRIL ) 10 MG tablet Take 1 tablet (10 mg total) by mouth 2 (two) times daily.   Magnesium 400 MG TABS Take 400 mg by mouth daily.   meloxicam (MOBIC) 7.5 MG tablet Take 7.5 mg by mouth 2 (two) times daily as needed for pain.   metFORMIN  (GLUCOPHAGE -XR) 500 MG 24 hr tablet TAKE 1 TABLET WITH EVENING MEAL EVERY DAY BY MOUTH   MOUNJARO 15 MG/0.5ML Pen SMARTSIG:15 Milligram(s) SUB-Q Once a Week   Multiple Vitamins-Minerals (MULTIVITAMIN WITH MINERALS) tablet Take 1 tablet by mouth daily.   Nebulizers (COMP AIR COMPRESSOR NEBULIZER) MISC as directed.   pantoprazole (PROTONIX) 40 MG tablet Take 40 mg by mouth daily as needed (acid reflux).   traMADol HCl 100 MG TABS every 6 hours as needed Orally Once a day   vitamin E 400 UNIT capsule Take 400 Units by mouth daily.   [DISCONTINUED] atorvastatin (LIPITOR) 10 MG tablet Take 10 mg by mouth at bedtime.   [DISCONTINUED] famotidine  (PEPCID ) 20 MG tablet TAKE 2 TABLETS BY MOUTH DAILY AS DIRECTED   No facility-administered encounter medications on file as of 08/23/2024.    ALLERGIES:  Allergies  Allergen Reactions   Peanut-Containing Drug  Products Hives and Itching   Bextra [Valdecoxib] Hives   Morphine And Codeine  Itching   Other     Pt is allergic to many eye drops.    Penicillins Hives and Swelling    No Ciillins per pt. Has patient had a PCN reaction causing immediate rash, facial/tongue/throat swelling, SOB or lightheadedness with hypotension: No Has patient had a PCN reaction causing severe rash involving mucus membranes or skin necrosis: No Has patient had a PCN reaction that required hospitalization No Has patient had a PCN reaction occurring within the last 10 years: No If all of the above answers are NO, then may proceed with Cephalosporin use.   Strawberry Flavoring Agent (Non-Screening) Itching   Tobramycin Hives    LABORATORY DATA:  I have reviewed the labs as listed.  CBC    Component Value Date/Time   WBC 9.9 08/09/2024 0925   RBC 5.65 (H) 08/09/2024 0925   HGB 12.1 08/09/2024 0925   HCT 40.3 08/09/2024 0925   PLT 313 08/09/2024 0925   MCV 71.3 (L) 08/09/2024 0925   MCH 21.4 (L) 08/09/2024 0925   MCHC 30.0 08/09/2024 0925   RDW 15.4 08/09/2024 0925   LYMPHSABS 2.9 08/09/2024 0925   MONOABS 0.7 08/09/2024 0925   EOSABS 0.1 08/09/2024 0925   BASOSABS 0.0 08/09/2024 0925      Latest Ref Rng & Units 08/09/2024    9:25 AM 07/28/2023   11:13 AM 08/14/2022   11:58 AM  CMP  Glucose 70 - 99 mg/dL 99  870  888   BUN 8 - 23 mg/dL 16  11  13    Creatinine 0.44 - 1.00 mg/dL 9.03  9.04  9.15   Sodium 135 - 145  mmol/L 138  135  138   Potassium 3.5 - 5.1 mmol/L 4.3  3.6  3.8   Chloride 98 - 111 mmol/L 102  100  105   CO2 22 - 32 mmol/L 27  27  26    Calcium 8.9 - 10.3 mg/dL 9.4  8.9  8.9   Total Protein 6.5 - 8.1 g/dL  6.7  6.9   Total Bilirubin 0.3 - 1.2 mg/dL  0.6  0.4   Alkaline Phos 38 - 126 U/L  75  57   AST 15 - 41 U/L  17  17   ALT 0 - 44 U/L  22  18     DIAGNOSTIC IMAGING:  I have independently reviewed the relevant imaging and discussed with the patient.   WRAP UP:  All questions  were answered. The patient knows to call the clinic with any problems, questions or concerns.  Medical decision making: Moderate  Time spent on visit: I spent 20 minutes counseling the patient face to face. The total time spent in the appointment was 30 minutes and more than 50% was on counseling.  Allison CHRISTELLA Barefoot, PA-C  08/23/24 9:45 AM

## 2024-08-23 ENCOUNTER — Inpatient Hospital Stay: Attending: Physician Assistant | Admitting: Physician Assistant

## 2024-08-23 VITALS — BP 135/85 | HR 64 | Temp 97.7°F | Resp 18 | Wt 247.0 lb

## 2024-08-23 DIAGNOSIS — E538 Deficiency of other specified B group vitamins: Secondary | ICD-10-CM | POA: Diagnosis not present

## 2024-08-23 DIAGNOSIS — K648 Other hemorrhoids: Secondary | ICD-10-CM | POA: Insufficient documentation

## 2024-08-23 DIAGNOSIS — Z807 Family history of other malignant neoplasms of lymphoid, hematopoietic and related tissues: Secondary | ICD-10-CM | POA: Diagnosis not present

## 2024-08-23 DIAGNOSIS — Z803 Family history of malignant neoplasm of breast: Secondary | ICD-10-CM | POA: Insufficient documentation

## 2024-08-23 DIAGNOSIS — D72820 Lymphocytosis (symptomatic): Secondary | ICD-10-CM | POA: Insufficient documentation

## 2024-08-23 DIAGNOSIS — D563 Thalassemia minor: Secondary | ICD-10-CM | POA: Insufficient documentation

## 2024-08-23 DIAGNOSIS — K760 Fatty (change of) liver, not elsewhere classified: Secondary | ICD-10-CM | POA: Diagnosis not present

## 2024-08-23 DIAGNOSIS — Z806 Family history of leukemia: Secondary | ICD-10-CM | POA: Diagnosis not present

## 2024-08-23 DIAGNOSIS — R7689 Other specified abnormal immunological findings in serum: Secondary | ICD-10-CM | POA: Diagnosis not present

## 2024-08-23 DIAGNOSIS — D508 Other iron deficiency anemias: Secondary | ICD-10-CM

## 2024-08-23 DIAGNOSIS — D509 Iron deficiency anemia, unspecified: Secondary | ICD-10-CM | POA: Insufficient documentation

## 2024-08-23 DIAGNOSIS — K449 Diaphragmatic hernia without obstruction or gangrene: Secondary | ICD-10-CM | POA: Insufficient documentation

## 2024-08-23 NOTE — Patient Instructions (Signed)
 [facility] **VISIT SUMMARY & IMPORTANT INSTRUCTIONS **   You were seen today by Pleasant Barefoot PA-C for your chronic anemia.    IRON  DEFICIENCY: Your iron  levels are currently normal.  You should take iron  supplement once or twice a week to maintain adequate iron  levels.  You can take either of the following types of iron  supplement: Ferrous bisglycinate  Geritol (contains 18 mg liquid iron ) Take your iron  supplement with a glass of orange juice or other source of vitamin C to help your body absorb it better.  Take your iron  separated by at least 2 hours from your antacid medication.  CHRONIC ANEMIA & MICROCYTOSIS You have chronic anemia and microcytosis (small red blood cells). This is due to an inherited condition called alpha thalassemia trait. You do not need any treatment for your alpha thalassemia trait, but it explains your mild chronic anemia.  VITAMIN B12 DEFICIENCY Continue taking vitamin B12 tablet, but DECREASE the frequency to take it once or twice each week.  ELEVATED LIGHT CHAINS Your labs dated NOT show any evidence of multiple myeloma cancer as the cause of elevated light chains.  Will recheck these labs in 1 year. Elevated kappa light chains are commonly caused by benign conditions such as chronic inflammation, stress, infection, kidney impairment, liver disease, and many other causes.  ELEVATED WHITE BLOOD CELLS: This is likely a response to inflammation.  Your most recent white blood cells are normal.  FOLLOW-UP APPOINTMENT: 1 year  ** Thank you for trusting me with your healthcare!  I strive to provide all of my patients with quality care at each visit.  If you receive a survey for this visit, I would be so grateful to you for taking the time to provide feedback.  Thank you in advance!  ~ Hezzie Karim                                        Dr. Mickiel Davonna Pleasant Barefoot, PA-C     Delon Hope, NP   - - - - - - - -  - - - - - - - - - -     Thank you for choosing [facility] at [facility] to provide your oncology and hematology care.  To afford each patient quality time with our provider, please arrive at least 15 minutes before your scheduled appointment time.   If you have a lab appointment with the Cancer Center please come in thru the Main Entrance and check in at the main information desk.  You need to re-schedule your appointment should you arrive 10 or more minutes late.  We strive to give you quality time with our providers, and arriving late affects you and other patients whose appointments are after yours.  Also, if you no show three or more times for appointments you may be dismissed from the clinic at the providers discretion.     Again, thank you for choosing [facility].  Our hope is that these requests will decrease the amount of time that you wait before being seen by our physicians.       _____________________________________________________________  Should you have questions after your visit to [facility], please contact our office at [phone] and follow the prompts.  Our office hours are 8:00 a.m. and 4:30 p.m.  Monday - Friday.  Please note that voicemails left after 4:00 p.m. may not be returned until the following business day.  We are closed weekends and major holidays.  You do have access to a nurse 24-7, just call the main number to the clinic [phone] and do not press any options, hold on the line and a nurse will answer the phone.    For prescription refill requests, have your pharmacy contact our office and allow 72 hours.

## 2024-09-04 ENCOUNTER — Other Ambulatory Visit (HOSPITAL_COMMUNITY): Payer: Self-pay | Admitting: Gastroenterology

## 2024-09-04 DIAGNOSIS — K5900 Constipation, unspecified: Secondary | ICD-10-CM

## 2024-09-21 ENCOUNTER — Other Ambulatory Visit (HOSPITAL_COMMUNITY): Payer: Self-pay | Admitting: Sports Medicine

## 2024-09-21 DIAGNOSIS — M79604 Pain in right leg: Secondary | ICD-10-CM

## 2024-09-27 ENCOUNTER — Ambulatory Visit (HOSPITAL_COMMUNITY)
Admission: RE | Admit: 2024-09-27 | Discharge: 2024-09-27 | Disposition: A | Discharge status: Home / Self Care | Source: Ambulatory Visit | Attending: Sports Medicine | Admitting: Sports Medicine

## 2024-09-27 ENCOUNTER — Ambulatory Visit (HOSPITAL_COMMUNITY)
Admission: RE | Admit: 2024-09-27 | Discharge: 2024-09-27 | Disposition: A | Discharge status: Home / Self Care | Source: Ambulatory Visit | Attending: Gastroenterology | Admitting: Gastroenterology

## 2024-09-27 DIAGNOSIS — M79605 Pain in left leg: Secondary | ICD-10-CM | POA: Diagnosis present

## 2024-09-27 DIAGNOSIS — M79604 Pain in right leg: Secondary | ICD-10-CM | POA: Diagnosis present

## 2024-09-27 DIAGNOSIS — K5900 Constipation, unspecified: Secondary | ICD-10-CM | POA: Insufficient documentation

## 2024-10-16 ENCOUNTER — Encounter: Payer: Self-pay | Admitting: *Deleted

## 2025-08-22 ENCOUNTER — Inpatient Hospital Stay

## 2025-08-29 ENCOUNTER — Inpatient Hospital Stay: Admitting: Physician Assistant
